# Patient Record
Sex: Female | Born: 1965 | Race: Black or African American | Hispanic: No | Smoking: Former smoker
Health system: Southern US, Community
[De-identification: ages and names within clinical notes are randomized; demographics above are authoritative.]

---

## 2006-12-20 ENCOUNTER — Emergency Department: Payer: Self-pay | Admitting: Emergency Medicine

## 2007-09-10 ENCOUNTER — Emergency Department: Payer: Self-pay | Admitting: Emergency Medicine

## 2008-07-22 ENCOUNTER — Emergency Department: Payer: Self-pay | Admitting: Emergency Medicine

## 2010-11-29 ENCOUNTER — Emergency Department: Payer: Self-pay | Admitting: Emergency Medicine

## 2011-07-11 HISTORY — PX: ANKLE SURGERY: SHX546

## 2012-09-10 ENCOUNTER — Emergency Department: Payer: Self-pay | Admitting: Emergency Medicine

## 2014-09-03 ENCOUNTER — Emergency Department: Payer: Self-pay | Admitting: Emergency Medicine

## 2014-12-26 ENCOUNTER — Emergency Department
Admission: EM | Admit: 2014-12-26 | Discharge: 2014-12-26 | Disposition: A | Discharge status: Home / Self Care | Payer: Self-pay | Attending: Emergency Medicine | Admitting: Emergency Medicine

## 2014-12-26 ENCOUNTER — Encounter: Payer: Self-pay | Admitting: Emergency Medicine

## 2014-12-26 DIAGNOSIS — L089 Local infection of the skin and subcutaneous tissue, unspecified: Secondary | ICD-10-CM | POA: Insufficient documentation

## 2014-12-26 DIAGNOSIS — Z720 Tobacco use: Secondary | ICD-10-CM | POA: Insufficient documentation

## 2014-12-26 DIAGNOSIS — H6123 Impacted cerumen, bilateral: Secondary | ICD-10-CM | POA: Insufficient documentation

## 2014-12-26 MED ORDER — CARBAMIDE PEROXIDE 6.5 % OT SOLN: 5.0000 [drp] | Freq: Two times a day (BID) | OTIC | Status: AC

## 2014-12-26 MED ORDER — CEPHALEXIN 500 MG PO CAPS: 500.0000 mg | ORAL_CAPSULE | Freq: Three times a day (TID) | ORAL | Status: DC

## 2014-12-26 NOTE — ED Notes (Signed)
NAD noted at time of D/C. Pt denies questions or concerns. Pt ambulatory to the lobby at this time.  

## 2014-12-26 NOTE — ED Notes (Signed)
States she developed some itching to left ear yesterday ..put a hair clip in ear to scratch it. Continues to have pain

## 2014-12-26 NOTE — Discharge Instructions (Signed)
Cerumen Impaction °A cerumen impaction is when the wax in your ear forms a plug. This plug usually causes reduced hearing. Sometimes it also causes an earache or dizziness. Removing a cerumen impaction can be difficult and painful. The wax sticks to the ear canal. The canal is sensitive and bleeds easily. If you try to remove a heavy wax buildup with a cotton tipped swab, you may push it in further. °Irrigation with water, suction, and small ear curettes may be used to clear out the wax. If the impaction is fixed to the skin in the ear canal, ear drops may be needed for a few days to loosen the wax. People who build up a lot of wax frequently can use ear wax removal products available in your local drugstore. °SEEK MEDICAL CARE IF:  °You develop an earache, increased hearing loss, or marked dizziness. °Document Released: 08/03/2004 Document Revised: 09/18/2011 Document Reviewed: 09/23/2009 °ExitCare® Patient Information ©2015 [employer]. This information is not intended to replace advice given to you by your health care provider. Make sure you discuss any questions you have with your health care provider. ° °

## 2014-12-26 NOTE — ED Provider Notes (Signed)
[facility] Emergency Department Provider Note  ____________________________________________  Time seen: 10:42 AM  I have reviewed the triage vital signs and the nursing notes.   HISTORY  Chief Complaint Otalgia   HPI Deanna Jones is a 49 y.o. female states that her left ear was itching yesterday and she used a hair clip to scratch it. Now she has pain to her left ear and is swelling. She denies any problems with hearing. At this time she cannot put a score to her pain. Not taking any medication over-the-counter for pain. Is not tried any medication for the itching in her ear as well.   History reviewed. No pertinent past medical history.  There are no active problems to display for this patient.   History reviewed. No pertinent past surgical history.  Current Outpatient Rx  Name  Route  Sig  Dispense  Refill  . carbamide peroxide (DEBROX) 6.5 % otic solution   Both Ears   Place 5 drops into both ears 2 (two) times daily.   15 mL   2   . cephALEXin (KEFLEX) 500 MG capsule   Oral   Take 1 capsule (500 mg total) by mouth 3 (three) times daily.   21 capsule   0     Allergies Review of patient's allergies indicates no known allergies.  No family history on file.  Social History History  Substance Use Topics  . Smoking status: Current Some Day Smoker  . Smokeless tobacco: Not on file  . Alcohol Use: No    Review of Systems Constitutional: No fever/chills ENT: No sore throat. Cardiovascular: Denies chest pain. Respiratory: Denies shortness of breath. Gastrointestinal: No abdominal pain.  No nausea, no vomiting. Genitourinary: Negative for dysuria. Musculoskeletal: Negative for back pain. Skin: Negative for rash. Neurological: Negative for headaches  10-point ROS otherwise negative.  ____________________________________________   PHYSICAL EXAM:  VITAL SIGNS: ED Triage Vitals  Enc Vitals Group     BP --      Pulse --       Resp --      Temp --      Temp src --      SpO2 --      Weight --      Height --      Head Cir --      Peak Flow --      Pain Score --      Pain Loc --      Pain Edu? --      Excl. in GC? --     Constitutional: Alert and oriented. Well appearing and in no acute distress. Eyes: Conjunctivae are normal. PERRL. EOMI. Head: Atraumatic. Nose: No congestion/rhinnorhea. Bilateral EACs are occluded with cerumen. TMs were not visible. Left EAC is edematous with a superficial abrasion most likely from the hair clip she was using. There is some mild edema in the area present. There is moderate tenderness on touching the ear. Mouth/Throat: Mucous membranes are moist.  Oropharynx non-erythematous. Neck: No stridor.  Supple Hematological/Lymphatic/Immunilogical: No cervical lymphadenopathy. Cardiovascular: Normal rate, regular rhythm. Grossly normal heart sounds.  Good peripheral circulation. Respiratory: Normal respiratory effort.  No retractions. Lungs CTAB. Gastrointestinal: Soft and nontender. No distention. Musculoskeletal: No lower extremity tenderness nor edema.  No joint effusions. Neurologic:  Normal speech and language. No gross focal neurologic deficits are appreciated. Speech is normal. No gait instability. Skin:  Skin is warm, dry. There is some mild edema as described on the  left ear externally. See above note. Psychiatric: Mood and affect are normal. Speech and behavior are normal.  ____________________________________________   LABS (all labs ordered are listed, but only abnormal results are displayed)  Labs Reviewed - No data to display  PROCEDURES  Procedure(s) performed: None  Critical Care performed: No  ____________________________________________   INITIAL IMPRESSION / ASSESSMENT AND PLAN / ED COURSE  Pertinent labs & imaging results that were available during my care of the patient were reviewed by me and considered in my medical decision making (see chart  for details).  Patient is to follow-up with Dr. Willeen Cass at [facility] ear nose and throat if any continued problems. ____________________________________________   FINAL CLINICAL IMPRESSION(S) / ED DIAGNOSES  Final diagnoses:  Cerumen impaction, bilateral  Superficial skin infection      Tommi Rumps, PA-C 12/26/14 1133  Emily Filbert, MD 12/26/14 1435

## 2015-01-16 ENCOUNTER — Encounter: Payer: Self-pay | Admitting: General Practice

## 2015-01-16 ENCOUNTER — Emergency Department
Admission: EM | Admit: 2015-01-16 | Discharge: 2015-01-16 | Disposition: A | Discharge status: Home / Self Care | Payer: Self-pay | Attending: Emergency Medicine | Admitting: Emergency Medicine

## 2015-01-16 DIAGNOSIS — Y998 Other external cause status: Secondary | ICD-10-CM | POA: Insufficient documentation

## 2015-01-16 DIAGNOSIS — L089 Local infection of the skin and subcutaneous tissue, unspecified: Secondary | ICD-10-CM

## 2015-01-16 DIAGNOSIS — Y9289 Other specified places as the place of occurrence of the external cause: Secondary | ICD-10-CM | POA: Insufficient documentation

## 2015-01-16 DIAGNOSIS — Z720 Tobacco use: Secondary | ICD-10-CM | POA: Insufficient documentation

## 2015-01-16 DIAGNOSIS — Z79899 Other long term (current) drug therapy: Secondary | ICD-10-CM | POA: Insufficient documentation

## 2015-01-16 DIAGNOSIS — W57XXXA Bitten or stung by nonvenomous insect and other nonvenomous arthropods, initial encounter: Secondary | ICD-10-CM | POA: Insufficient documentation

## 2015-01-16 DIAGNOSIS — Y9389 Activity, other specified: Secondary | ICD-10-CM | POA: Insufficient documentation

## 2015-01-16 DIAGNOSIS — S80862A Insect bite (nonvenomous), left lower leg, initial encounter: Secondary | ICD-10-CM | POA: Insufficient documentation

## 2015-01-16 DIAGNOSIS — Z792 Long term (current) use of antibiotics: Secondary | ICD-10-CM | POA: Insufficient documentation

## 2015-01-16 MED ORDER — SULFAMETHOXAZOLE-TRIMETHOPRIM 800-160 MG PO TABS: 1.0000 | ORAL_TABLET | Freq: Two times a day (BID) | ORAL | Status: DC

## 2015-01-16 MED ORDER — TRAMADOL HCL 50 MG PO TABS: 50.0000 mg | ORAL_TABLET | Freq: Four times a day (QID) | ORAL | Status: DC | PRN

## 2015-01-16 MED ORDER — LIDOCAINE-EPINEPHRINE-TETRACAINE (LET) SOLUTION
NASAL | Status: AC
  Filled 2015-01-16: qty 3

## 2015-01-16 NOTE — ED Notes (Signed)
Pt. arrived to ed from home with an abscess to left lower leg. Pt reports site appeared yesterday, reports unknown cause. Pt. Alert and Oriented. Ambulatory with no Acute distress. No drainage from site at this time.

## 2015-01-16 NOTE — ED Provider Notes (Signed)
[facility] Emergency Department Provider Note  ____________________________________________  Time seen: Approximately 9:44 AM  I have reviewed the triage vital signs and the nursing notes.   HISTORY  Chief Complaint Abscess    HPI Deanna Jones is a 49 y.o. female patient has multiple erythematous papular lesions on her body. Patient's chief concern is one that a papular lesions attending to a bullous lesion on her left lower leg. Patient states she normally tries to care express material from the lesions but she is concerned because of the size of the swelling patient denies any fever with this complaint she denies any loss of sensation or loss of function of the left lower extremity. Patient states there is no pain with this complaint.  History reviewed. No pertinent past medical history.  There are no active problems to display for this patient.   History reviewed. No pertinent past surgical history.  Current Outpatient Rx  Name  Route  Sig  Dispense  Refill  . carbamide peroxide (DEBROX) 6.5 % otic solution   Both Ears   Place 5 drops into both ears 2 (two) times daily.   15 mL   2   . cephALEXin (KEFLEX) 500 MG capsule   Oral   Take 1 capsule (500 mg total) by mouth 3 (three) times daily.   21 capsule   0   . sulfamethoxazole-trimethoprim (BACTRIM DS,SEPTRA DS) 800-160 MG per tablet   Oral   Take 1 tablet by mouth 2 (two) times daily.   20 tablet   0   . traMADol (ULTRAM) 50 MG tablet   Oral   Take 1 tablet (50 mg total) by mouth every 6 (six) hours as needed for moderate pain.   12 tablet   0     Allergies Review of patient's allergies indicates no known allergies.  No family history on file.  Social History History  Substance Use Topics  . Smoking status: Current Some Day Smoker -- 0.50 packs/day    Types: Cigarettes  . Smokeless tobacco: Not on file  . Alcohol Use: No    Review of Systems Constitutional: No  fever/chills Eyes: No visual changes. ENT: No sore throat. Cardiovascular: Denies chest pain. Respiratory: Denies shortness of breath. Gastrointestinal: No abdominal pain.  No nausea, no vomiting.  No diarrhea.  No constipation. Genitourinary: Negative for dysuria. Musculoskeletal: Negative for back pain. Skin: Negative for rash. Multiple erythematous papular lesions on the lower extremities and 1 list lesion on the left leg. lesion Neurological: Negative for headaches, focal weakness or numbness. 10-point ROS otherwise negative.  ____________________________________________   PHYSICAL EXAM:  VITAL SIGNS: ED Triage Vitals  Enc Vitals Group     BP 01/16/15 0922 126/68 mmHg     Pulse Rate 01/16/15 0922 79     Resp 01/16/15 0922 17     Temp 01/16/15 0922 97.7 F (36.5 C)     Temp Source 01/16/15 0922 Oral     SpO2 01/16/15 0922 99 %     Weight 01/16/15 0922 160 lb (72.576 kg)     Height 01/16/15 0922  (1.549 m)     Head Cir --      Peak Flow --      Pain Score 01/16/15 0923 0     Pain Loc --      Pain Edu? --      Excl. in GC? --    Constitutional: Alert and oriented. Well appearing and in no acute distress.  Appears anxious Eyes: Conjunctivae are normal. PERRL. EOMI. Head: Atraumatic. Nose: No congestion/rhinnorhea. Mouth/Throat: Mucous membranes are moist.  Oropharynx non-erythematous. Neck: No stridor. No cervical spine tenderness to palpation. Hematological/Lymphatic/Immunilogical: No cervical lymphadenopathy. Cardiovascular: Normal rate, regular rhythm. Grossly normal heart sounds.  Good peripheral circulation. Respiratory: Normal respiratory effort.  No retractions. Lungs CTAB. Gastrointestinal: Soft and nontender. No distention. No abdominal bruits. No CVA tenderness. Musculoskeletal: No lower extremity tenderness nor edema.  No joint effusions. Neurologic:  Normal speech and language. No gross focal neurologic deficits are appreciated. Speech is normal. No gait  instability. Skin:  Skin is warm, dry and intact. No rash noted. Psychiatric: Mood and affect are normal. Speech and behavior are normal.  ____________________________________________   LABS (all labs ordered are listed, but only abnormal results are displayed)  Labs Reviewed - No data to display ____________________________________________  EKG   ____________________________________________  RADIOLOGY   ____________________________________________   PROCEDURES  Procedure(s) performed: None  Critical Care performed: No  ____________________________________________   INITIAL IMPRESSION / ASSESSMENT AND PLAN / ED COURSE  Pertinent labs & imaging results that were available during my care of the patient were reviewed by me and considered in my medical decision making (see chart for details).  Infected insect bite left lower extremity. Couple anesthesia was applied to the area not to 5 minutes 18-gauge needle puncture expressed purulent material which was sent off for culture and sensitivity. Area was cleaned and bandage. She'll get a prescription for Bactrim and tramadol to take as directed. ____________________________________________   FINAL CLINICAL IMPRESSION(S) / ED DIAGNOSES  Final diagnoses:  Infected insect bite of left leg, initial encounter      Joni ReiningRonald K Braylei Totino, PA-C 01/16/15 1013  Minna AntisKevin Paduchowski, MD 01/16/15 1527

## 2015-01-16 NOTE — ED Notes (Signed)
D/c instructions reviewed w/ pt - pt denies any further questions or concerns at present.  Pt instructed to not use alcohol, drive, or operate heavy machinery while take the prescription pain medications as they could make him drowsy - pt verbalized understanding.   

## 2015-05-31 ENCOUNTER — Encounter: Payer: Self-pay | Admitting: Emergency Medicine

## 2015-05-31 ENCOUNTER — Emergency Department
Admission: EM | Admit: 2015-05-31 | Discharge: 2015-05-31 | Disposition: A | Discharge status: Home / Self Care | Payer: Self-pay | Attending: Emergency Medicine | Admitting: Emergency Medicine

## 2015-05-31 DIAGNOSIS — H6121 Impacted cerumen, right ear: Secondary | ICD-10-CM | POA: Insufficient documentation

## 2015-05-31 DIAGNOSIS — F17210 Nicotine dependence, cigarettes, uncomplicated: Secondary | ICD-10-CM | POA: Insufficient documentation

## 2015-05-31 DIAGNOSIS — I499 Cardiac arrhythmia, unspecified: Secondary | ICD-10-CM | POA: Insufficient documentation

## 2015-05-31 DIAGNOSIS — H9201 Otalgia, right ear: Secondary | ICD-10-CM | POA: Insufficient documentation

## 2015-05-31 LAB — CBC
HCT: 45.1 % (ref 35.0–47.0)
Hemoglobin: 15.2 g/dL (ref 12.0–16.0)
MCH: 31.8 pg (ref 26.0–34.0)
MCHC: 33.6 g/dL (ref 32.0–36.0)
MCV: 94.4 fL (ref 80.0–100.0)
Platelets: 474 10*3/uL — ABNORMAL HIGH (ref 150–440)
RBC: 4.78 MIL/uL (ref 3.80–5.20)
RDW: 13.4 % (ref 11.5–14.5)
WBC: 10.2 10*3/uL (ref 3.6–11.0)

## 2015-05-31 LAB — COMPREHENSIVE METABOLIC PANEL
ALT: 21 U/L (ref 14–54)
AST: 26 U/L (ref 15–41)
Albumin: 4.2 g/dL (ref 3.5–5.0)
Alkaline Phosphatase: 82 U/L (ref 38–126)
Anion gap: 7 (ref 5–15)
BUN: 13 mg/dL (ref 6–20)
CO2: 27 mmol/L (ref 22–32)
Calcium: 9.2 mg/dL (ref 8.9–10.3)
Chloride: 106 mmol/L (ref 101–111)
Creatinine, Ser: 0.63 mg/dL (ref 0.44–1.00)
GFR calc Af Amer: >60 mL/min (ref >60)
GFR calc non Af Amer: >60 mL/min (ref >60)
Glucose, Bld: 106 mg/dL — ABNORMAL HIGH (ref 65–99)
Potassium: 4.8 mmol/L (ref 3.5–5.1)
Sodium: 140 mmol/L (ref 135–145)
Total Bilirubin: 0.9 mg/dL (ref 0.3–1.2)
Total Protein: 8 g/dL (ref 6.5–8.1)

## 2015-05-31 LAB — TROPONIN I: Troponin I: <0.03 ng/mL (ref <0.031)

## 2015-05-31 NOTE — Discharge Instructions (Signed)
You were found to have an abnormal heart rate in the emergency department which appeared to normalize prior to discharge. Please follow-up with cardiology. Return to the emergency department if you have any dizziness or chest pain   Earache An earache, also called otalgia, can be caused by many things. Pain from an earache can be sharp, dull, or burning. The pain may be temporary or constant. Earaches can be caused by problems with the ear, such as infection in either the middle ear or the ear canal, injury, impacted ear wax, middle ear pressure, or a foreign body in the ear. Ear pain can also result from problems in other areas. This is called referred pain. For example, pain can come from a sore throat, a tooth infection, or problems with the jaw or the joint between the jaw and the skull (temporomandibular joint, or TMJ). The cause of an earache is not always easy to identify. Watchful waiting may be appropriate for some earaches until a clear cause of the pain can be found. HOME CARE INSTRUCTIONS Watch your condition for any changes. The following actions may help to lessen any discomfort that you are feeling:  Take medicines only as directed by your health care provider. This includes ear drops.  Apply ice to your outer ear to help reduce pain.  Put ice in a plastic bag.  Place a towel between your skin and the bag.  Leave the ice on for 20 minutes, 2-3 times per day.  Do not put anything in your ear other than medicine that is prescribed by your health care provider.  Try resting in an upright position instead of lying down. This may help to reduce pressure in the middle ear and relieve pain.  Chew gum if it helps to relieve your ear pain.  Control any allergies that you have.  Keep all follow-up visits as directed by your health care provider. This is important. SEEK MEDICAL CARE IF:  Your pain does not improve within 2 days.  You have a fever.  You have new or worsening  symptoms. SEEK IMMEDIATE MEDICAL CARE IF:  You have a severe headache.  You have a stiff neck.  You have difficulty swallowing.  You have redness or swelling behind your ear.  You have drainage from your ear.  You have hearing loss.  You feel dizzy.   This information is not intended to replace advice given to you by your health care provider. Make sure you discuss any questions you have with your health care provider.   Document Released: 02/11/2004 Document Revised: 07/17/2014 Document Reviewed: 01/25/2014 Elsevier Interactive Patient Education [employer]2016 Elsevier Inc.

## 2015-05-31 NOTE — ED Notes (Signed)
Pt discharged home after verbalizing understanding of discharge instructions; nad noted. 

## 2015-05-31 NOTE — ED Notes (Signed)
Patient brought her daughter in to be seen and began having right sided ear pain. Wants to be checked.

## 2015-05-31 NOTE — ED Provider Notes (Signed)
[facility] Emergency Department Provider Note  ____________________________________________  Time seen: On arrival  I have reviewed the triage vital signs and the nursing notes.   HISTORY  Chief Complaint Otalgia    HPI Deanna Jones is a 49 y.o. female who presents with complaints of right ear discomfort. She only noticed it this morning. She is here with her daughter to have her evaluated for upper respiratory infection but decided to get checked in as well. She denies hearing difficulty. No fevers or chills. No chest pain or shortness of breath or cough. No foreign bodies in the ear    History reviewed. No pertinent past medical history.  There are no active problems to display for this patient.   History reviewed. No pertinent past surgical history.  Current Outpatient Rx  Name  Route  Sig  Dispense  Refill  . ibuprofen (ADVIL,MOTRIN) 200 MG tablet   Oral   Take 200 mg by mouth every 6 (six) hours as needed for mild pain or moderate pain.          . carbamide peroxide (DEBROX) 6.5 % otic solution   Both Ears   Place 5 drops into both ears 2 (two) times daily. Patient not taking: Reported on 05/31/2015   15 mL   2   . cephALEXin (KEFLEX) 500 MG capsule   Oral   Take 1 capsule (500 mg total) by mouth 3 (three) times daily. Patient not taking: Reported on 05/31/2015   21 capsule   0   . sulfamethoxazole-trimethoprim (BACTRIM DS,SEPTRA DS) 800-160 MG per tablet   Oral   Take 1 tablet by mouth 2 (two) times daily. Patient not taking: Reported on 05/31/2015   20 tablet   0   . traMADol (ULTRAM) 50 MG tablet   Oral   Take 1 tablet (50 mg total) by mouth every 6 (six) hours as needed for moderate pain. Patient not taking: Reported on 05/31/2015   12 tablet   0     Allergies Review of patient's allergies indicates no known allergies.  No family history on file.  Social History Social History  Substance Use Topics  .  Smoking status: Current Some Day Smoker -- 0.50 packs/day    Types: Cigarettes  . Smokeless tobacco: None  . Alcohol Use: No    Review of Systems  Constitutional: Negative for fever. Eyes: Negative for visual changes. ENT: Negative for sore throat, positive for ear discomfort   Genitourinary: Negative for dysuria. Musculoskeletal: Negative for back pain. Skin: Negative for rash. Neurological: Negative for headaches or focal weakness   ____________________________________________   PHYSICAL EXAM:  VITAL SIGNS: ED Triage Vitals  Enc Vitals Group     BP 05/31/15 0743 146/93 mmHg     Pulse Rate 05/31/15 0743 39     Resp 05/31/15 0743 16     Temp 05/31/15 0743 98.2 F (36.8 C)     Temp Source 05/31/15 0743 Oral     SpO2 05/31/15 0743 97 %     Weight 05/31/15 0743 158 lb (71.668 kg)     Height 05/31/15 0743 5\' 1"  (1.549 m)     Head Cir --      Peak Flow --      Pain Score 05/31/15 0832 1     Pain Loc --      Pain Edu? --      Excl. in GC? --      Constitutional: Alert and oriented. Well appearing  and in no distress. Eyes: Conjunctivae are normal.  ENT   Head: Normocephalic and atraumatic.   Mouth/Throat: Mucous membranes are moist. Ear: Significant cerumen in right ear canal, after removal no foreign body, normal TM Cardiovascular: Normal rate, regular rhythm.  Respiratory: Normal respiratory effort without tachypnea nor retractions.  Gastrointestinal: Soft and non-tender in all quadrants. No distention. There is no CVA tenderness. Musculoskeletal: Nontender with normal range of motion in all extremities. Neurologic:  Normal speech and language. No gross focal neurologic deficits are appreciated. Skin:  Skin is warm, dry and intact. No rash noted. Psychiatric: Mood and affect are normal. Patient exhibits appropriate insight and judgment.  ____________________________________________    LABS (pertinent positives/negatives)  Labs Reviewed  CBC -  Abnormal; Notable for the following:    Platelets 474 (*)    All other components within normal limits  COMPREHENSIVE METABOLIC PANEL - Abnormal; Notable for the following:    Glucose, Bld 106 (*)    All other components within normal limits  TROPONIN I    ____________________________________________   ED ECG REPORT I, Jene Every, the attending physician, personally viewed and interpreted this ECG.   Date: 05/31/2015  EKG Time: 8:30 AM  Rate: 73  Rhythm: Sinus arrhythmia  Axis: Normal  Intervals:none  ST&T Change: Normal  ED ECG REPORT I, Jene Every, the attending physician, personally viewed and interpreted this ECG.   Date: 05/31/2015  EKG Time: 10:34 AM  Rate: 63  Rhythm: normal EKG, normal sinus rhythm  Axis: Normal  Intervals:none  ST&T Change: Normal  ____________________________________________    RADIOLOGY I have personally reviewed any xrays that were ordered on this patient: None  ____________________________________________   PROCEDURES  Procedure(s) performed: cerumen disimpaction with curette and irrigation performed by me   ____________________________________________   INITIAL IMPRESSION / ASSESSMENT AND PLAN / ED COURSE  Pertinent labs & imaging results that were available during my care of the patient were reviewed by me and considered in my medical decision making (see chart for details).  Patient noted to have abnormal heart rhythm in triage so EKG was done which shows a sinus arrhythmia, labs are unremarkable. Repeat EKG is normal sinus rhythm and a normal rate. Patient has no symptoms and is anxious to leave. She agrees to follow up with cardiology.  ____________________________________________   FINAL CLINICAL IMPRESSION(S) / ED DIAGNOSES  Final diagnoses:  Otalgia of right ear  Cardiac arrhythmia, unspecified cardiac arrhythmia type     Jene Every, MD 05/31/15 1536

## 2015-05-31 NOTE — ED Notes (Signed)
Pt here for ear ache (mostly here for daughter, but decided to be seen as well). Irregular HR noted upon triage, so cardiac assessment performed. Pt denies chest pain, sob, any other symptoms. NAD noted.

## 2015-06-15 ENCOUNTER — Encounter: Payer: Self-pay | Admitting: Emergency Medicine

## 2015-06-15 ENCOUNTER — Emergency Department
Admission: EM | Admit: 2015-06-15 | Discharge: 2015-06-15 | Disposition: A | Discharge status: Home / Self Care | Payer: Self-pay | Attending: Emergency Medicine | Admitting: Emergency Medicine

## 2015-06-15 DIAGNOSIS — Z792 Long term (current) use of antibiotics: Secondary | ICD-10-CM | POA: Insufficient documentation

## 2015-06-15 DIAGNOSIS — H109 Unspecified conjunctivitis: Secondary | ICD-10-CM | POA: Insufficient documentation

## 2015-06-15 DIAGNOSIS — F17210 Nicotine dependence, cigarettes, uncomplicated: Secondary | ICD-10-CM | POA: Insufficient documentation

## 2015-06-15 MED ORDER — SULFACETAMIDE SODIUM 10 % OP SOLN: 2.0000 [drp] | Freq: Four times a day (QID) | OPHTHALMIC | Status: DC

## 2015-06-15 NOTE — ED Notes (Signed)
Unable to sign due to computer issues

## 2015-06-15 NOTE — Discharge Instructions (Signed)

## 2015-06-15 NOTE — ED Notes (Signed)
C/o pain and pressure behind eyes and redness and itching to both eyes since she woke up this am

## 2015-06-15 NOTE — ED Notes (Signed)
Pt complaining of pressure above both eyes x 1 day, as well as redness, itching.  Some redness noted.

## 2015-06-15 NOTE — ED Provider Notes (Signed)
[facility] Emergency Department Provider Note  ____________________________________________  Time seen: Approximately 3:11 PM  I have reviewed the triage vital signs and the nursing notes.   HISTORY  Chief Complaint Eye Pain   HPI Deanna Jones is a 49 y.o. female complains of bilateral eye pain with redness every morning with itching. Occasional drainage bilaterally this morning. Feels pressure behind both eyes. Denies any runny nose sinus congestion or drainage.    History reviewed. No pertinent past medical history.  There are no active problems to display for this patient.   History reviewed. No pertinent past surgical history.  Current Outpatient Rx  Name  Route  Sig  Dispense  Refill  . carbamide peroxide (DEBROX) 6.5 % otic solution   Both Ears   Place 5 drops into both ears 2 (two) times daily. Patient not taking: Reported on 05/31/2015   15 mL   2   . cephALEXin (KEFLEX) 500 MG capsule   Oral   Take 1 capsule (500 mg total) by mouth 3 (three) times daily. Patient not taking: Reported on 05/31/2015   21 capsule   0   . ibuprofen (ADVIL,MOTRIN) 200 MG tablet   Oral   Take 200 mg by mouth every 6 (six) hours as needed for mild pain or moderate pain.          Marland Kitchen sulfacetamide (BLEPH-10) 10 % ophthalmic solution   Both Eyes   Place 2 drops into both eyes 4 (four) times daily.   5 mL   0   . sulfamethoxazole-trimethoprim (BACTRIM DS,SEPTRA DS) 800-160 MG per tablet   Oral   Take 1 tablet by mouth 2 (two) times daily. Patient not taking: Reported on 05/31/2015   20 tablet   0   . traMADol (ULTRAM) 50 MG tablet   Oral   Take 1 tablet (50 mg total) by mouth every 6 (six) hours as needed for moderate pain. Patient not taking: Reported on 05/31/2015   12 tablet   0     Allergies Review of patient's allergies indicates no known allergies.  No family history on file.  Social History Social History  Substance Use  Topics  . Smoking status: Current Some Day Smoker -- 0.50 packs/day    Types: Cigarettes  . Smokeless tobacco: None  . Alcohol Use: No    Review of Systems Constitutional: No fever/chills Eyes: No visual changes. Positive eye redness ENT: No sore throat. Cardiovascular: Denies chest pain. Respiratory: Denies shortness of breath. Gastrointestinal: No abdominal pain.  No nausea, no vomiting.  No diarrhea.  No constipation. Genitourinary: Negative for dysuria. Musculoskeletal: Negative for back pain. Skin: Negative for rash. Neurological: Negative for headaches, focal weakness or numbness.  10-point ROS otherwise negative.  ____________________________________________   PHYSICAL EXAM:  VITAL SIGNS: ED Triage Vitals  Enc Vitals Group     BP 06/15/15 1410 157/83 mmHg     Pulse Rate 06/15/15 1410 50     Resp 06/15/15 1410 18     Temp 06/15/15 1410 98.1 F (36.7 C)     Temp Source 06/15/15 1410 Oral     SpO2 06/15/15 1410 98 %     Weight 06/15/15 1410 160 lb (72.576 kg)     Height 06/15/15 1410  (1.549 m)     Head Cir --      Peak Flow --      Pain Score 06/15/15 1410 3     Pain Loc --  Pain Edu? --      Excl. in GC? --     Constitutional: Alert and oriented. Well appearing and in no acute distress. Eyes: Conjunctivae are normal. PERRL. EOMI. conjunctiva slightly erythematous bilateral with no obvious discharge. Head: Atraumatic. Nose: No congestion/rhinnorhea. Mouth/Throat: Mucous membranes are moist.  Oropharynx non-erythematous. Neck: No stridor.   Musculoskeletal: No lower extremity tenderness nor edema.  No joint effusions. Neurologic:  Normal speech and language. No gross focal neurologic deficits are appreciated. No gait instability. Skin:  Skin is warm, dry and intact. No rash noted. Psychiatric: Mood and affect are normal. Speech and behavior are normal.  ____________________________________________   LABS (all labs ordered are listed, but only  abnormal results are displayed)  Labs Reviewed - No data to display ____________________________________________   PROCEDURES  Procedure(s) performed: None  Critical Care performed: No  ____________________________________________   INITIAL IMPRESSION / ASSESSMENT AND PLAN / ED COURSE  Pertinent labs & imaging results that were available during my care of the patient were reviewed by me and considered in my medical decision making (see chart for details).  Bilateral conjunctivitis. Rx given for Sodium Sulamyd eye drops. Patient follow-up with PCP or return here for any worsening symptomology. Patient voices no other emergency medical complaints at this visit. ____________________________________________   FINAL CLINICAL IMPRESSION(S) / ED DIAGNOSES  Final diagnoses:  Bilateral conjunctivitis      Evangeline DakinCharles M Courtnay Petrilla, PA-C 06/15/15 1557  Rockne MenghiniAnne-Caroline Norman, MD 06/15/15 2002

## 2017-04-30 ENCOUNTER — Emergency Department
Admission: EM | Admit: 2017-04-30 | Discharge: 2017-04-30 | Disposition: A | Discharge status: Home / Self Care | Payer: Self-pay | Attending: Emergency Medicine | Admitting: Emergency Medicine

## 2017-04-30 ENCOUNTER — Encounter: Payer: Self-pay | Admitting: Emergency Medicine

## 2017-04-30 DIAGNOSIS — M545 Low back pain, unspecified: Secondary | ICD-10-CM

## 2017-04-30 DIAGNOSIS — Z79899 Other long term (current) drug therapy: Secondary | ICD-10-CM | POA: Insufficient documentation

## 2017-04-30 DIAGNOSIS — F17210 Nicotine dependence, cigarettes, uncomplicated: Secondary | ICD-10-CM | POA: Insufficient documentation

## 2017-04-30 LAB — URINALYSIS, COMPLETE (UACMP) WITH MICROSCOPIC
Bilirubin Urine: NEGATIVE
Glucose, UA: NEGATIVE mg/dL
Hgb urine dipstick: NEGATIVE
Ketones, ur: NEGATIVE mg/dL
Leukocytes, UA: NEGATIVE
Nitrite: NEGATIVE
Protein, ur: NEGATIVE mg/dL
Specific Gravity, Urine: 1.01 (ref 1.005–1.030)
WBC, UA: NONE SEEN WBC/hpf (ref 0–5)
pH: 8 (ref 5.0–8.0)

## 2017-04-30 MED ORDER — NAPROXEN 500 MG PO TABS: 500.0000 mg | ORAL_TABLET | Freq: Two times a day (BID) | ORAL | 2 refills | Status: DC

## 2017-04-30 NOTE — ED Provider Notes (Signed)
[facility] Emergency Department Provider Note   ____________________________________________    I have reviewed the triage vital signs and the nursing notes.   HISTORY  Chief Complaint Back pain    HPI Deanna Jones is a 51 y.o. female who presents with complaints of back pain. 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 She denies numbness or tingling in her lower extremities. No dysuria. No history of kidney stones. Pain is worse when she twists her body. She has not taken anything for this. Pain is mild 1 lying still, worse with movement.   History reviewed. No pertinent past medical history.  There are no active problems to display for this patient.   History reviewed. No pertinent surgical history.  Prior to Admission medications   Medication Sig Start Date End Date Taking? Authorizing Provider  cephALEXin (KEFLEX) 500 MG capsule Take 1 capsule (500 mg total) by mouth 3 (three) times daily. Patient not taking: Reported on 05/31/2015 12/26/14   Tommi RumpsSummers, Rhonda L, PA-C  ibuprofen (ADVIL,MOTRIN) 200 MG tablet Take 200 mg by mouth every 6 (six) hours as needed for mild pain or moderate pain.     [provider]  naproxen (NAPROSYN) 500 MG tablet Take 1 tablet (500 mg total) by mouth 2 (two) times daily with a meal. 04/30/17   Jene EveryKinner, Clemence Lengyel, MD  sulfacetamide (BLEPH-10) 10 % ophthalmic solution Place 2 drops into both eyes 4 (four) times daily. 06/15/15   Beers, Charmayne Sheerharles M, PA-C  sulfamethoxazole-trimethoprim (BACTRIM DS,SEPTRA DS) 800-160 MG per tablet Take 1 tablet by mouth 2 (two) times daily. Patient not taking: Reported on 05/31/2015 01/16/15   Joni ReiningSmith, Ronald K, PA-C  traMADol (ULTRAM) 50 MG tablet Take 1 tablet (50 mg total) by mouth every 6 (six) hours as needed for moderate pain. Patient not taking: Reported on 05/31/2015 01/16/15   Joni ReiningSmith, Ronald K, PA-C      Allergies Patient has no known allergies.  No family history on file.  Social History Social History  Substance Use Topics  . Smoking status: Current Some Day Smoker    Packs/day: 0.50    Types: Cigarettes  . Smokeless tobacco: Never Used  . Alcohol use No    Review of Systems  Constitutional: No fever/chills Eyes: No visual changes.  ENT: No sore throat. Cardiovascular: Denies chest pain. Respiratory: Denies shortness of breath. Gastrointestinal: No abdominal pain.  No nausea, no vomiting.   Genitourinary: Negative for dysuria. Musculoskeletal: As above Skin: Negative for rash. Neurological: Negative for headaches   ____________________________________________   PHYSICAL EXAM:  VITAL SIGNS: ED Triage Vitals  Enc Vitals Group     BP 04/30/17 1555 (!) 160/105     Pulse Rate 04/30/17 1555 70     Resp 04/30/17 1555 20     Temp 04/30/17 1555 98.4 F (36.9 C)     Temp Source 04/30/17 1555 Oral     SpO2 04/30/17 1555 98 %     Weight 04/30/17 1555 72.6 kg (160 lb)     Height 04/30/17 1555 1.549 m (5\' 1" )     Head Circumference --      Peak Flow --      Pain Score 04/30/17 1554 7     Pain Loc --      Pain Edu? --      Excl. in GC? --     Constitutional: Alert  and oriented. No acute distress. Pleasant and interactive Eyes: Conjunctivae are normal.  H Nose: No congestion/rhinnorhea. Mouth/Throat: Mucous membranes are moist.    Cardiovascular: Normal rate, regular rhythm. ood peripheral circulation. Respiratory: Normal respiratory effort.  No retractions. Gastrointestinal: Soft and nontender. No distention.  No CVA tenderness. Genitourinary: deferred Musculoskeletal: Patient with a muscle spasm/tenderness to palpation at approximately T11, right paraspinal area. No rash Neurologic:  Normal speech and language. No gross focal neurologic deficits are appreciated.  Skin:  Skin is warm, dry and intact. No rash noted. Psychiatric: Mood and affect are normal.  Speech and behavior are normal.  ____________________________________________   LABS (all labs ordered are listed, but only abnormal results are displayed)  Labs Reviewed  URINALYSIS, COMPLETE (UACMP) WITH MICROSCOPIC - Abnormal; Notable for the following:       Result Value   Color, Urine YELLOW (*)    APPearance CLEAR (*)    Bacteria, UA RARE (*)    Squamous Epithelial / LPF 0-5 (*)    All other components within normal limits   ____________________________________________  EKG  None ____________________________________________  RADIOLOGY  None ____________________________________________   PROCEDURES  Procedure(s) performed: No    Critical Care performed: No ____________________________________________   INITIAL IMPRESSION / ASSESSMENT AND PLAN / ED COURSE  Pertinent labs & imaging results that were available during my care of the patient were reviewed by me and considered in my medical decision making (see chart for details).  Patient's symptoms are not consistent with kidney stone. Urinalysis is unremarkable. Exam is most consistent with muscular skeletal pain, small spasm. Patient is tender as described above. We'll treat with NSAIDs and rest.    ____________________________________________   FINAL CLINICAL IMPRESSION(S) / ED DIAGNOSES  Final diagnoses:  Acute right-sided low back pain without sciatica      NEW MEDICATIONS STARTED DURING THIS VISIT:  Discharge Medication List as of 04/30/2017  5:28 PM    START taking these medications   Details  naproxen (NAPROSYN) 500 MG tablet Take 1 tablet (500 mg total) by mouth 2 (two) times daily with a meal., Starting Mon 04/30/2017, Print         Note:  This document was prepared using Dragon voice recognition software and may include unintentional dictation errors.    Jene Every, MD 04/30/17 2213

## 2017-04-30 NOTE — ED Triage Notes (Signed)
Patient presents to the ED with right flank pain that began suddenly today and caused patient to double over during work.  Patient reports urinary frequency.  Patient is in no obvious distress at this time.

## 2017-04-30 NOTE — ED Notes (Signed)
Pt states that she has had flank pain that started today in the mid of her back and moved more to the right side. Pt states the pain 3/10. Pt is A/O

## 2019-09-27 ENCOUNTER — Ambulatory Visit: Payer: Self-pay | Attending: Internal Medicine

## 2019-09-27 DIAGNOSIS — Z23 Encounter for immunization: Secondary | ICD-10-CM

## 2019-09-27 NOTE — Progress Notes (Signed)
   Covid-19 Vaccination Clinic  Name:  Deanna Jones    MRN: [id number] DOB: [birth year]  09/27/2019  Deanna Jones was observed post Covid-19 immunization for 15 minutes without incident. She was provided with Vaccine Information Sheet and instruction to access the V-Safe system.   Deanna Jones was instructed to call 911 with any severe reactions post vaccine: Marland Kitchen Difficulty breathing  . Swelling of face and throat  . A fast heartbeat  . A bad rash all over body  . Dizziness and weakness   Immunizations Administered    Name Date Dose VIS Date Route   Pfizer COVID-19 Vaccine 09/27/2019  1:24 PM 0.3 mL 06/20/2019 Intramuscular   Manufacturer: [employer], [employer]   Lot: [id number]   NDC: [address]-2

## 2019-10-22 ENCOUNTER — Ambulatory Visit: Payer: Self-pay | Attending: Internal Medicine

## 2019-10-22 DIAGNOSIS — Z23 Encounter for immunization: Secondary | ICD-10-CM

## 2019-10-22 NOTE — Progress Notes (Signed)
   Covid-19 Vaccination Clinic  Name:  Barri Neidlinger    MRN: [id number] DOB: Jan 02, 1966  10/22/2019  Ms. Weldon Inches was observed post Covid-19 immunization for 15 minutes without incident. She was provided with Vaccine Information Sheet and instruction to access the V-Safe system.   Ms. Sadia Belfiore was instructed to call 911 with any severe reactions post vaccine: Marland Kitchen Difficulty breathing  . Swelling of face and throat  . A fast heartbeat  . A bad rash all over body  . Dizziness and weakness   Immunizations Administered    Name Date Dose VIS Date Route   Pfizer COVID-19 Vaccine 10/22/2019 10:46 AM 0.3 mL 06/20/2019 Intramuscular   Manufacturer: [employer], [employer]   Lot: [id number]   NDC: [address]-3

## 2020-01-05 ENCOUNTER — Other Ambulatory Visit: Payer: Self-pay

## 2020-06-10 ENCOUNTER — Ambulatory Visit: Payer: Self-pay

## 2020-06-26 ENCOUNTER — Ambulatory Visit
Admission: EM | Admit: 2020-06-26 | Discharge: 2020-06-26 | Disposition: A | Discharge status: Home / Self Care | Payer: 59 | Attending: Urgent Care | Admitting: Urgent Care

## 2020-06-26 ENCOUNTER — Ambulatory Visit (INDEPENDENT_AMBULATORY_CARE_PROVIDER_SITE_OTHER): Payer: 59

## 2020-06-26 ENCOUNTER — Other Ambulatory Visit: Payer: Self-pay

## 2020-06-26 DIAGNOSIS — S92352A Displaced fracture of fifth metatarsal bone, left foot, initial encounter for closed fracture: Secondary | ICD-10-CM

## 2020-06-26 DIAGNOSIS — S92515A Nondisplaced fracture of proximal phalanx of left lesser toe(s), initial encounter for closed fracture: Secondary | ICD-10-CM

## 2020-06-26 DIAGNOSIS — M79676 Pain in unspecified toe(s): Secondary | ICD-10-CM

## 2020-06-26 DIAGNOSIS — S93105A Unspecified dislocation of left toe(s), initial encounter: Secondary | ICD-10-CM

## 2020-06-26 MED ORDER — NAPROXEN 500 MG PO TABS: 500.0000 mg | ORAL_TABLET | Freq: Two times a day (BID) | ORAL | 0 refills | Status: DC

## 2020-06-26 MED ORDER — IBUPROFEN 800 MG PO TABS
800.0000 mg | ORAL_TABLET | Freq: Once | ORAL | Status: AC
  Administered 2020-06-26: 800 mg via ORAL

## 2020-06-26 NOTE — ED Provider Notes (Signed)
[facility]   MRN: [id number] DOB: 1965-12-10  Subjective:   Deanna Jones is a 54 y.o. female presenting for suffering a left pinky toe injury today.  Patient states that she stubbed her toe against floor shoe molding.  She felt immediate pain.  Has been walking with a limp.  No current facility-administered medications for this encounter.  Current Outpatient Medications:  .  ibuprofen (ADVIL,MOTRIN) 200 MG tablet, Take 200 mg by mouth every 6 (six) hours as needed for mild pain or moderate pain. , Disp: , Rfl:  .  cephALEXin (KEFLEX) 500 MG capsule, Take 1 capsule (500 mg total) by mouth 3 (three) times daily. (Patient not taking: No sig reported), Disp: 21 capsule, Rfl: 0 .  naproxen (NAPROSYN) 500 MG tablet, Take 1 tablet (500 mg total) by mouth 2 (two) times daily with a meal., Disp: 20 tablet, Rfl: 2 .  sulfacetamide (BLEPH-10) 10 % ophthalmic solution, Place 2 drops into both eyes 4 (four) times daily., Disp: 5 mL, Rfl: 0 .  sulfamethoxazole-trimethoprim (BACTRIM DS,SEPTRA DS) 800-160 MG per tablet, Take 1 tablet by mouth 2 (two) times daily. (Patient not taking: No sig reported), Disp: 20 tablet, Rfl: 0 .  traMADol (ULTRAM) 50 MG tablet, Take 1 tablet (50 mg total) by mouth every 6 (six) hours as needed for moderate pain. (Patient not taking: No sig reported), Disp: 12 tablet, Rfl: 0   No Known Allergies  History reviewed. No pertinent past medical history.   History reviewed. No pertinent surgical history.  History reviewed. No pertinent family history.  Social History   Tobacco Use  . Smoking status: Current Some Day Smoker    Packs/day: 0.50    Types: Cigarettes  . Smokeless tobacco: Never Used  Vaping Use  . Vaping Use: Never used  Substance Use Topics  . Alcohol use: No  . Drug use: Never    ROS   Objective:   Vitals: BP (!) 148/79 (BP Location: Left Arm)   Pulse 74   Temp 98.2 F (36.8 C) (Oral)   Resp 18   SpO2 96%   Physical  Exam Constitutional:      General: She is not in acute distress.    Appearance: Normal appearance. She is well-developed. She is obese. She is not ill-appearing, toxic-appearing or diaphoretic.  HENT:     Head: Normocephalic and atraumatic.     Nose: Nose normal.     Mouth/Throat:     Mouth: Mucous membranes are moist.     Pharynx: Oropharynx is clear.  Eyes:     General: No scleral icterus.    Extraocular Movements: Extraocular movements intact.     Pupils: Pupils are equal, round, and reactive to light.  Cardiovascular:     Rate and Rhythm: Normal rate.  Pulmonary:     Effort: Pulmonary effort is normal.  Musculoskeletal:       Legs:  Skin:    General: Skin is warm and dry.  Neurological:     General: No focal deficit present.     Mental Status: She is alert and oriented to person, place, and time.  Psychiatric:        Mood and Affect: Mood normal.        Behavior: Behavior normal.        Thought Content: Thought content normal.        Judgment: Judgment normal.    Offered patient Tylenol and ibuprofen for pain but she refused.  States that she  prefers to do things naturally.  DG Toe 5th Left  Result Date: 06/26/2020 CLINICAL DATA:  Stubbed toe EXAM: DG TOE 5TH LEFT COMPARISON:  None. FINDINGS: There is posterior subluxation of the fifth PIP. There is a ossific fragment along the medial aspect of the distal proximal fifth phalanx. Mild irregularity of the proximal base of the middle fifth phalanx could reflect sequela of impaction. Soft tissue swelling. No additional acute fracture or dislocation. IMPRESSION: 1. Posterior subluxation of the fifth PIP joint. 2. Likely avulsion fracture fragment along the medial aspect of the distal proximal fifth phalanx. Electronically Signed   By: Meda Klinefelter MD   On: 06/26/2020 13:53   Local anesthetic applied to the left fifth base of the toe.  Using my fingers I grabbed the distal part of the toe, pulled and applied an upward  pressure to the proximal base of the toe.  Felt a popping sensation and straightening of her toe.  Patient tolerated procedure well.  DG Toe 5th Left  Result Date: 06/26/2020 CLINICAL DATA:  Follow-up reduction of little toe. EXAM: DG TOE 5TH LEFT COMPARISON:  None. FINDINGS: The left little toe PIP joint has been reduced. PIP joint is located on these views. Again noted is an avulsion fraction and probably originating from the medial base of the middle phalanx. IMPRESSION: Little toe PIP joint has been successfully reduced. Avulsion fracture involving the medial base of the little toe middle phalanx. Electronically Signed   By: Richarda Overlie M.D.   On: 06/26/2020 15:06   DG Toe 5th Left  Result Date: 06/26/2020 CLINICAL DATA:  Stubbed toe EXAM: DG TOE 5TH LEFT COMPARISON:  None. FINDINGS: There is posterior subluxation of the fifth PIP. There is a ossific fragment along the medial aspect of the distal proximal fifth phalanx. Mild irregularity of the proximal base of the middle fifth phalanx could reflect sequela of impaction. Soft tissue swelling. No additional acute fracture or dislocation. IMPRESSION: 1. Posterior subluxation of the fifth PIP joint. 2. Likely avulsion fracture fragment along the medial aspect of the distal proximal fifth phalanx. Electronically Signed   By: Meda Klinefelter MD   On: 06/26/2020 13:53    Assessment and Plan :   PDMP not reviewed this encounter.  1. Pain of fifth toe   2. Toe dislocation, left, initial encounter   3. Closed nondisplaced fracture of proximal phalanx of lesser toe of left foot, initial encounter     Subluxation of left fifth toe successfully reduced.  Given her toe fracture, placed her in a postop shoe.  Use naproxen for pain and inflammation.  Follow-up with podiatry. Counseled patient on potential for adverse effects with medications prescribed/recommended today, ER and return-to-clinic precautions discussed, patient verbalized  understanding.    Wallis Bamberg, PA-C 06/26/20 1517

## 2020-06-26 NOTE — Discharge Instructions (Signed)
[facility] ([facility] Podiatrist in [place], [place] COVID-19 info: [email or web address] Get online care: [email or web address] Address: [address], [place], [place] [address] Phone: [phone] Appointments: [email or web address]   [facility], [place], [place] Doctor in [place], [place] Address: [address], , [place] [address] Phone: [phone]  

## 2020-06-26 NOTE — ED Triage Notes (Signed)
Patient stubbed her toe in her foyer this morning injuring her left pinky toe. Pt is aox4 and ambulates with a slight limp.

## 2020-07-01 ENCOUNTER — Other Ambulatory Visit: Payer: Self-pay

## 2020-07-01 ENCOUNTER — Encounter: Payer: Self-pay | Admitting: Podiatry

## 2020-07-01 ENCOUNTER — Ambulatory Visit (INDEPENDENT_AMBULATORY_CARE_PROVIDER_SITE_OTHER): Payer: 59 | Admitting: Podiatry

## 2020-07-01 DIAGNOSIS — S92502A Displaced unspecified fracture of left lesser toe(s), initial encounter for closed fracture: Secondary | ICD-10-CM

## 2020-07-01 NOTE — Patient Instructions (Addendum)
You can use diclofenac gel as needed ----  Toe Fracture A toe fracture is a break in one of the toe bones (phalanges). This may happen if you:  Drop a heavy object on your toe.  Stub your toe.  Twist your toe.  Exercise the same way too much. What are the signs or symptoms? The main symptoms are swelling and pain in the toe. You may also have:  Bruising.  Stiffness.  Numbness.  A change in the way the toe looks.  Broken bones that poke through the skin.  Blood under the toenail. How is this treated? Treatments may include:  Taping the broken toe to a toe that is next to it (buddy taping).  Wearing a shoe that has a wide, rigid sole to protect the toe and to limit its movement.  Wearing a cast.  Surgery. This may be needed if the: ? Pieces of broken bone are out of place. ? Bone pokes through the skin.  Physical therapy. Follow these instructions at home: If you have a shoe:  Wear the shoe as told by your doctor. Remove it only as told by your doctor.  Loosen the shoe if your toes tingle, become numb, or turn cold and blue.  Keep the shoe clean and dry. If you have a cast:  Do not put pressure on any part of the cast until it is fully hardened. This may take a few hours.  Do not stick anything inside the cast to scratch your skin.  Check the skin around the cast every day. Tell your doctor about any concerns.  You may put lotion on dry skin around the edges of the cast.  Do not put lotion on the skin under the cast.  Keep the cast clean and dry. Bathing  Do not take baths, swim, or use a hot tub until your doctor says it is okay. Ask your doctor if you can take showers.  If the shoe or cast is not waterproof: ? Do not let it get wet. ? Cover it with a watertight covering when you take a bath or a shower. Activity  Do not use your foot to support your body weight until your doctor says it is okay.  Use crutches as told by your doctor.  Ask your  doctor what activities are safe for you during recovery.  Avoid activities as told by your doctor.  Do exercises as told by your doctor or therapist. Driving  Do not drive or use heavy machinery while taking pain medicine.  Do not drive while wearing a cast on a foot that you use for driving. Managing pain, stiffness, and swelling   Put ice on the injured area if told by your doctor: ? Put ice in a plastic bag. ? Place a towel between your skin and the bag.  If you have a shoe, remove it as told by your doctor.  If you have a cast, place a towel between your cast and the bag. ? Leave the ice on for 20 minutes, 2-3 times per day.  Raise (elevate) the injured area above the level of your heart while you are sitting or lying down. General instructions  If your toe was taped to a toe that is next to it, follow your doctor's instructions for changing the gauze and tape. Change it more often: ? If the gauze and tape get wet. If this happens, dry the space between the toes. ? If the gauze and tape are too  tight and they cause your toe to become pale or to lose feeling (go numb).  If your doctor did not give you a protective shoe, wear sturdy shoes that support your foot. Your shoes should not: ? Pinch your toes. ? Fit tightly against your toes.  Do not use any tobacco products, including cigarettes, chewing tobacco, or e-cigarettes. These can delay bone healing. If you need help quitting, ask your doctor.  Take medicines only as told by your doctor.  Keep all follow-up visits as told by your doctor. This is important. Contact a doctor if:  Your pain medicine is not helping.  You have a fever.  You notice a bad smell coming from your cast. Get help right away if:  You lose feeling (have numbness) in your toe or foot, and it is getting worse.  Your toe or your foot tingles.  Your toe or your foot gets cold or turns blue.  You have redness or swelling in your toe or foot,  and it is getting worse.  You have very bad pain. Summary  A toe fracture is a break in one of the toe bones.  Use ice and raise your foot. This will help lessen pain and swelling.  Use crutches as told by your doctor. This information is not intended to replace advice given to you by your health care provider. Make sure you discuss any questions you have with your health care provider. Document Revised: 08/30/2017 Document Reviewed: 08/07/2017 Elsevier Patient Education  2020 [employer].

## 2020-07-07 NOTE — Progress Notes (Signed)
Subjective:   Patient ID: Deanna Jones, female   DOB: 54 y.o.   MRN: [id number]   HPI 54 year old female presents the office today for concerns of left fifth toe injury which occurred on December 18.  She is been wearing surgical shoe.  She was seen in the emergency department after she had a closed reduction of the fifth toe.  She states that she been doing well she is actually on her feet quite a bit recently without any significant pain.  She has no other concerns today.    Review of Systems  All other systems reviewed and are negative.  History reviewed. No pertinent past medical history.  History reviewed. No pertinent surgical history.  No current outpatient medications on file.  No Known Allergies        Objective:  Physical Exam  General: AAO x3, NAD  Dermatological: Skin is warm, dry and supple bilateral. There are no open sores, no preulcerative lesions, no rash or signs of infection present.  Vascular: Dorsalis Pedis artery and Posterior Tibial artery pedal pulses are 2/4 bilateral with immedate capillary fill time. There is no pain with calf compression, swelling, warmth, erythema.   Neruologic: Grossly intact via light touch bilateral.  Musculoskeletal: There is mild edema present left fifth toe with minimal pain.  There is no pain in the metatarsal other digits.  The toes in rectus position.  Muscular strength 5/5 in all groups tested bilateral.  Gait: Unassisted, Nonantalgic.       Assessment:   Left fifth toe fracture, dislocation     Plan:  -Treatment options discussed including all alternatives, risks, and complications -Etiology of symptoms were discussed -I independently reviewed the x-rays from the emergency department.   -At this time I like recommend surgical shoe the next couple of weeks to lateral ligaments as well as the fracture to heal better.  Discussed in about 2 weeks she can start to transition back to regular shoe as tolerated as  long as there is no pain.  Discussed wearing stiffer soled shoe.  Continue ice elevation.  Deanna Jones DPM

## 2020-07-21 ENCOUNTER — Other Ambulatory Visit: Payer: 59

## 2020-07-21 DIAGNOSIS — Z20822 Contact with and (suspected) exposure to covid-19: Secondary | ICD-10-CM

## 2020-07-23 LAB — NOVEL CORONAVIRUS, NAA: SARS-CoV-2, NAA: NOT DETECTED

## 2020-07-23 LAB — SARS-COV-2, NAA 2 DAY TAT

## 2020-07-29 ENCOUNTER — Ambulatory Visit (INDEPENDENT_AMBULATORY_CARE_PROVIDER_SITE_OTHER): Payer: 59

## 2020-07-29 ENCOUNTER — Ambulatory Visit (INDEPENDENT_AMBULATORY_CARE_PROVIDER_SITE_OTHER): Payer: 59 | Admitting: Podiatry

## 2020-07-29 ENCOUNTER — Other Ambulatory Visit: Payer: Self-pay

## 2020-07-29 DIAGNOSIS — S92502D Displaced unspecified fracture of left lesser toe(s), subsequent encounter for fracture with routine healing: Secondary | ICD-10-CM | POA: Diagnosis not present

## 2020-07-29 DIAGNOSIS — S99922D Unspecified injury of left foot, subsequent encounter: Secondary | ICD-10-CM

## 2020-07-29 DIAGNOSIS — S92502A Displaced unspecified fracture of left lesser toe(s), initial encounter for closed fracture: Secondary | ICD-10-CM

## 2020-08-04 NOTE — Progress Notes (Signed)
Subjective: 55 year old female presents the office today for follow-up evaluation of left fifth toe fracture. She states that overall she has been doing well not having any significant discomfort. She is been wearing a surgical shoe. No significant swelling. No recent injury or fall since I last saw her no changes otherwise. Denies any systemic complaints such as fevers, chills, nausea, vomiting. No acute changes since last appointment, and no other complaints at this time.   Objective: AAO x3, NAD DP/PT pulses palpable bilaterally, CRT less than 3 seconds There is no tenderness palpation particular the left fifth toe there is no pain the other digits metatarsals. There is minimal edema to the toe and there is no erythema or warmth. No open lesions. Adductovarus is noted. No other areas of discomfort identified today. MMT 5/5. No pain with calf compression, swelling, warmth, erythema  Assessment: Left fifth toe fracture with improvement  Plan: -All treatment options discussed with the patient including all alternatives, risks, complications.  -Repeat x-rays obtained reviewed. Avulsion fracture noted the fifth digit but overall increased consolidation across the fracture site. -At this point she is doing well and significant discomfort. Discussed gradual transition back to her regular shoe. There is any increase in pain to return to the surgical shoe to let me know. -Patient encouraged to call the office with any questions, concerns, change in symptoms.   Vivi Barrack DPM

## 2020-08-12 ENCOUNTER — Other Ambulatory Visit: Payer: Self-pay

## 2020-08-12 DIAGNOSIS — Z20822 Contact with and (suspected) exposure to covid-19: Secondary | ICD-10-CM

## 2020-08-13 LAB — NOVEL CORONAVIRUS, NAA: SARS-CoV-2, NAA: NOT DETECTED

## 2020-08-13 LAB — SARS-COV-2, NAA 2 DAY TAT

## 2020-08-18 ENCOUNTER — Other Ambulatory Visit: Payer: Self-pay

## 2020-08-18 DIAGNOSIS — Z20822 Contact with and (suspected) exposure to covid-19: Secondary | ICD-10-CM

## 2020-08-19 LAB — NOVEL CORONAVIRUS, NAA: SARS-CoV-2, NAA: DETECTED — AB

## 2020-08-19 LAB — SARS-COV-2, NAA 2 DAY TAT

## 2020-08-20 ENCOUNTER — Telehealth: Payer: Self-pay

## 2020-08-20 NOTE — Telephone Encounter (Signed)
Called to discuss with patient about COVID-19 symptoms and the use of one of the available treatments for those with mild to moderate Covid symptoms and at a high risk of hospitalization.  Pt appears to qualify for outpatient treatment due to co-morbid conditions and/or a member of an at-risk group in accordance with the FDA Emergency Use Authorization.    Symptom onset: Cough - 08/16/20 Vaccinated: Yes Booster? No Immunocompromised? No Qualifiers: None  Reports her symptoms are mild.  Deanna Jones

## 2020-08-25 ENCOUNTER — Other Ambulatory Visit: Payer: Self-pay

## 2020-08-25 DIAGNOSIS — Z20822 Contact with and (suspected) exposure to covid-19: Secondary | ICD-10-CM

## 2020-08-26 LAB — SARS-COV-2, NAA 2 DAY TAT

## 2020-08-26 LAB — NOVEL CORONAVIRUS, NAA

## 2021-06-13 ENCOUNTER — Ambulatory Visit
Admission: EM | Admit: 2021-06-13 | Discharge: 2021-06-13 | Disposition: A | Discharge status: Home / Self Care | Payer: 59 | Attending: Physician Assistant | Admitting: Physician Assistant

## 2021-06-13 ENCOUNTER — Other Ambulatory Visit: Payer: Self-pay

## 2021-06-13 DIAGNOSIS — J069 Acute upper respiratory infection, unspecified: Secondary | ICD-10-CM

## 2021-06-13 NOTE — ED Provider Notes (Signed)
[facility]    CSN: [id number] Arrival date & time: 06/13/21  1045      History   Chief Complaint Chief Complaint  Patient presents with   Generalized Body Aches   Cough    HPI Deanna Jones is a 55 y.o. female.   Patient here today for evaluation of nasal congestion and drainage, cough, body aches, chills and diarrhea that started today. She has had exposure to flu. She has tried OTC meds with mild relief.   The history is provided by the patient.   History reviewed. No pertinent past medical history.  There are no problems to display for this patient.   History reviewed. No pertinent surgical history.  OB History   No obstetric history on file.      Home Medications    Prior to Admission medications   Not on File    Family History No family history on file.  Social History Social History   Tobacco Use   Smoking status: Some Days    Packs/day: 0.50    Types: Cigarettes   Smokeless tobacco: Never  Vaping Use   Vaping Use: Never used  Substance Use Topics   Alcohol use: No   Drug use: Never     Allergies   Patient has no known allergies.   Review of Systems Review of Systems  Constitutional:  Positive for chills and fever (subjective).  HENT:  Positive for congestion and sore throat. Negative for ear pain.   Eyes:  Positive for discharge (tearing). Negative for redness.  Respiratory:  Positive for cough. Negative for shortness of breath and wheezing.   Gastrointestinal:  Positive for diarrhea. Negative for nausea and vomiting.    Physical Exam Triage Vital Signs ED Triage Vitals  Enc Vitals Group     BP      Pulse      Resp      Temp      Temp src      SpO2      Weight      Height      Head Circumference      Peak Flow      Pain Score      Pain Loc      Pain Edu?      Excl. in GC?    No data found.  Updated Vital Signs BP 111/74 (BP Location: Left Arm)   Pulse 77   Temp 98.7 F (37.1 C) (Oral)    Resp 18   Ht 5\' 1"  (1.549 m)   Wt 175 lb (79.4 kg)   SpO2 95%   BMI 33.07 kg/m      Physical Exam Vitals and nursing note reviewed.  Constitutional:      General: She is not in acute distress.    Appearance: Normal appearance. She is not ill-appearing.  HENT:     Head: Normocephalic and atraumatic.     Nose: Congestion present.     Mouth/Throat:     Mouth: Mucous membranes are moist.     Pharynx: No oropharyngeal exudate or posterior oropharyngeal erythema.  Eyes:     Conjunctiva/sclera: Conjunctivae normal.  Cardiovascular:     Rate and Rhythm: Normal rate and regular rhythm.     Heart sounds: Normal heart sounds. No murmur heard. Pulmonary:     Effort: Pulmonary effort is normal. No respiratory distress.     Breath sounds: Normal breath sounds. No wheezing, rhonchi or rales.  Skin:  General: Skin is warm and dry.  Neurological:     Mental Status: She is alert.  Psychiatric:        Mood and Affect: Mood normal.        Thought Content: Thought content normal.     UC Treatments / Results  Labs (all labs ordered are listed, but only abnormal results are displayed) Labs Reviewed  COVID-19, FLU A+B NAA    EKG   Radiology No results found.  Procedures Procedures (including critical care time)  Medications Ordered in UC Medications - No data to display  Initial Impression / Assessment and Plan / UC Course  I have reviewed the triage vital signs and the nursing notes.  Pertinent labs & imaging results that were available during my care of the patient were reviewed by me and considered in my medical decision making (see chart for details).    Suspect viral etiology of symptoms. Will order covid and flu screening. Recommend symptomatic treatment and follow up with any concerns.   Final Clinical Impressions(s) / UC Diagnoses   Final diagnoses:  Upper respiratory tract infection, unspecified type   Discharge Instructions   None    ED Prescriptions    None    PDMP not reviewed this encounter.   Tomi Bamberger, PA-C 06/13/21 1217

## 2021-06-13 NOTE — ED Triage Notes (Signed)
Pt c/o cold chills, diarrhea, cough, congestion, nasal drainage, body aches. Pt works at [employer] and has been around flu patients.

## 2021-06-14 LAB — COVID-19, FLU A+B NAA
Influenza A, NAA: DETECTED — AB
Influenza B, NAA: NOT DETECTED
SARS-CoV-2, NAA: NOT DETECTED

## 2021-12-30 IMAGING — DX DG TOE 5TH 2+V*L*
3 series · 3 of 3 positions shown · non-contrast
Comparison: None.

CLINICAL DATA: Follow-up reduction of little toe.

EXAM:
DG TOE 5TH LEFT

[toes dp (1 of 2)]
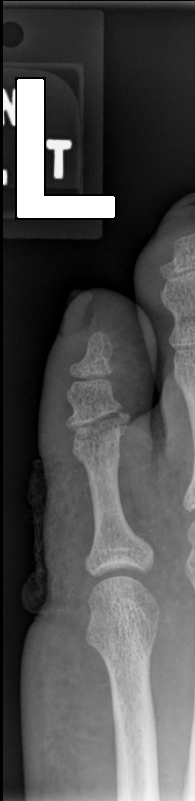

[toes dp (2 of 2)]
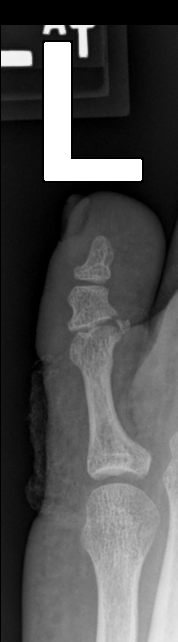

[toes lat]
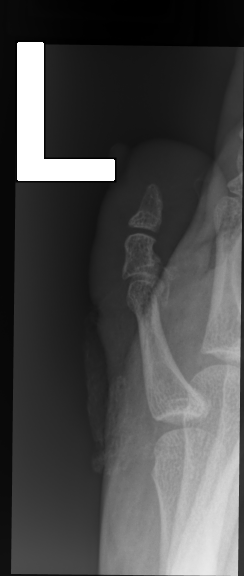

[3 of 3 positions shown; findings below may reference images not displayed]

FINDINGS: The left little toe PIP joint has been reduced. PIP joint is located
on these views. Again noted is an avulsion fraction and probably
originating from the medial base of the middle phalanx.
IMPRESSION: Little toe PIP joint has been successfully reduced.

Avulsion fracture involving the medial base of the little toe middle
phalanx.

## 2022-04-09 ENCOUNTER — Other Ambulatory Visit: Payer: Self-pay

## 2022-04-09 ENCOUNTER — Emergency Department
Admission: EM | Admit: 2022-04-09 | Discharge: 2022-04-09 | Disposition: A | Discharge status: Home / Self Care | Payer: Self-pay | Attending: Emergency Medicine | Admitting: Emergency Medicine

## 2022-04-09 ENCOUNTER — Ambulatory Visit: Payer: Self-pay

## 2022-04-09 DIAGNOSIS — R1032 Left lower quadrant pain: Secondary | ICD-10-CM | POA: Insufficient documentation

## 2022-04-09 DIAGNOSIS — R110 Nausea: Secondary | ICD-10-CM | POA: Insufficient documentation

## 2022-04-09 LAB — CBC
HCT: 43.7 % (ref 36.0–46.0)
Hemoglobin: 14.2 g/dL (ref 12.0–15.0)
MCH: 31 pg (ref 26.0–34.0)
MCHC: 32.5 g/dL (ref 30.0–36.0)
MCV: 95.4 fL (ref 80.0–100.0)
Platelets: 415 10*3/uL — ABNORMAL HIGH (ref 150–400)
RBC: 4.58 MIL/uL (ref 3.87–5.11)
RDW: 12.3 % (ref 11.5–15.5)
WBC: 7.9 10*3/uL (ref 4.0–10.5)
nRBC: 0 % (ref 0.0–0.2)

## 2022-04-09 LAB — COMPREHENSIVE METABOLIC PANEL
ALT: 24 U/L (ref 0–44)
AST: 22 U/L (ref 15–41)
Albumin: 4.3 g/dL (ref 3.5–5.0)
Alkaline Phosphatase: 75 U/L (ref 38–126)
Anion gap: 5 (ref 5–15)
BUN: 9 mg/dL (ref 6–20)
CO2: 25 mmol/L (ref 22–32)
Calcium: 9.2 mg/dL (ref 8.9–10.3)
Chloride: 111 mmol/L (ref 98–111)
Creatinine, Ser: 0.65 mg/dL (ref 0.44–1.00)
GFR, Estimated: >60 mL/min (ref >60)
Glucose, Bld: 115 mg/dL — ABNORMAL HIGH (ref 70–99)
Potassium: 3.5 mmol/L (ref 3.5–5.1)
Sodium: 141 mmol/L (ref 135–145)
Total Bilirubin: 0.8 mg/dL (ref 0.3–1.2)
Total Protein: 7.9 g/dL (ref 6.5–8.1)

## 2022-04-09 LAB — URINALYSIS, ROUTINE W REFLEX MICROSCOPIC
Bacteria, UA: NONE SEEN
Bilirubin Urine: NEGATIVE
Glucose, UA: NEGATIVE mg/dL
Hgb urine dipstick: NEGATIVE
Ketones, ur: NEGATIVE mg/dL
Leukocytes,Ua: NEGATIVE
Nitrite: NEGATIVE
Protein, ur: 30 mg/dL — AB
Specific Gravity, Urine: 1.024 (ref 1.005–1.030)
pH: 7 (ref 5.0–8.0)

## 2022-04-09 LAB — LIPASE, BLOOD: Lipase: 27 U/L (ref 11–51)

## 2022-04-09 MED ORDER — ONDANSETRON HCL 4 MG PO TABS: 4.0000 mg | ORAL_TABLET | Freq: Three times a day (TID) | ORAL | 0 refills | Status: DC | PRN

## 2022-04-09 MED ORDER — ONDANSETRON 4 MG PO TBDP
4.0000 mg | ORAL_TABLET | Freq: Once | ORAL | Status: AC
  Administered 2022-04-09: 4 mg via ORAL
  Filled 2022-04-09: qty 1

## 2022-10-21 ENCOUNTER — Ambulatory Visit
Admission: EM | Admit: 2022-10-21 | Discharge: 2022-10-21 | Disposition: A | Discharge status: Home / Self Care | Payer: No Typology Code available for payment source | Attending: Emergency Medicine | Admitting: Emergency Medicine

## 2022-10-21 DIAGNOSIS — R102 Pelvic and perineal pain: Secondary | ICD-10-CM | POA: Diagnosis present

## 2022-10-21 DIAGNOSIS — Z113 Encounter for screening for infections with a predominantly sexual mode of transmission: Secondary | ICD-10-CM | POA: Insufficient documentation

## 2022-10-21 NOTE — ED Triage Notes (Signed)
Patient presents at [facility] with vaginal pain that started today.

## 2022-10-21 NOTE — ED Provider Notes (Signed)
[facility]   [id number] 10/21/22 Arrival Time: 1119   Chief Complaint  Patient presents with   Vaginal Pain     SUBJECTIVE:  Deanna Jones is a 57 y.o. female resents to the urgent care for complaint of vaginal pain and like to have STD screening completed.  She reports her husband has been cheating other woman.  She would like to have STD screening.  She denies any other aggravating factors.  Reports pain is intermittent. She denies vaginal discharge.  Denies similar symptoms in the past.  She denies fever, chills, nausea, vomiting, abdominal or pelvic pain, urinary symptoms, vaginal itching, vaginal odor, vaginal bleeding, dyspareunia, vaginal rashes or lesions.   No LMP recorded. Patient is perimenopausal. Current birth control method: Compliant with BC:  ROS: As per HPI.  All other pertinent ROS negative.     No past medical history on file. Past Surgical History:  Procedure Laterality Date   ANKLE SURGERY  2013   No Known Allergies No current facility-administered medications on file prior to encounter.   Current Outpatient Medications on File Prior to Encounter  Medication Sig Dispense Refill   ondansetron (ZOFRAN) 4 MG tablet Take 1 tablet (4 mg total) by mouth every 8 (eight) hours as needed for vomiting or nausea. 15 tablet 0    Social History   Socioeconomic History   Marital status: Legally Separated    Spouse name: Not on file   Number of children: Not on file   Years of education: Not on file   Highest education level: Not on file  Occupational History   Not on file  Tobacco Use   Smoking status: Former    Packs/day: 1.00    Years: 25.00    Additional pack years: 0.00    Total pack years: 25.00    Types: Cigarettes    Quit date: 2016    Years since quitting: 8.2   Smokeless tobacco: Never  Vaping Use   Vaping Use: Never used  Substance and Sexual Activity   Alcohol use: Not Currently   Drug use: Never   Sexual activity: Not  Currently    Comment: since 2020  Other Topics Concern   Not on file  Social History Narrative   ** Merged History Encounter **       Social Determinants of Health   Financial Resource Strain: Not on file  Food Insecurity: Not on file  Transportation Needs: Not on file  Physical Activity: Not on file  Stress: Not on file  Social Connections: Not on file  Intimate Partner Violence: Not on file   No family history on file.  OBJECTIVE:  Vitals:   10/21/22 1145  BP: (!) 150/76  Pulse: (!) 42  Resp: 17  Temp: 97.6 F (36.4 C)  SpO2: 96%     General appearance: Alert, NAD, appears stated age Head: NCAT Throat: lips, mucosa, and tongue normal; teeth and gums normal Lungs: CTA bilaterally without adventitious breath sounds Heart: regular rate and rhythm.  Radial pulses 2+ symmetrical bilaterally Back: no CVA tenderness Abdomen: soft, non-tender; bowel sounds normal; no masses or organomegaly; no guarding or rebound tenderness GU: self Cervical swab obtained.  Vaginal exam deferred Skin: warm and dry Psychological:  Alert and cooperative. Normal mood and affect.  LABS:  Results for orders placed or performed during the hospital encounter of 04/09/22  Lipase, blood  Result Value Ref Range   Lipase 27 11 - 51 U/L  Comprehensive metabolic panel  Result Value Ref Range   Sodium 141 135 - 145 mmol/L   Potassium 3.5 3.5 - 5.1 mmol/L   Chloride 111 98 - 111 mmol/L   CO2 25 22 - 32 mmol/L   Glucose, Bld 115 (H) 70 - 99 mg/dL   BUN 9 6 - 20 mg/dL   Creatinine, Ser 1.61 0.44 - 1.00 mg/dL   Calcium 9.2 8.9 - 09.6 mg/dL   Total Protein 7.9 6.5 - 8.1 g/dL   Albumin 4.3 3.5 - 5.0 g/dL   AST 22 15 - 41 U/L   ALT 24 0 - 44 U/L   Alkaline Phosphatase 75 38 - 126 U/L   Total Bilirubin 0.8 0.3 - 1.2 mg/dL   GFR, Estimated >04 >54 mL/min   Anion gap 5 5 - 15  CBC  Result Value Ref Range   WBC 7.9 4.0 - 10.5 K/uL   RBC 4.58 3.87 - 5.11 MIL/uL   Hemoglobin 14.2 12.0 - 15.0 g/dL    HCT 09.8 11.9 - 14.7 %   MCV 95.4 80.0 - 100.0 fL   MCH 31.0 26.0 - 34.0 pg   MCHC 32.5 30.0 - 36.0 g/dL   RDW 82.9 56.2 - 13.0 %   Platelets 415 (H) 150 - 400 K/uL   nRBC 0.0 0.0 - 0.2 %  Urinalysis, Routine w reflex microscopic  Result Value Ref Range   Color, Urine YELLOW (A) YELLOW   APPearance CLEAR (A) CLEAR   Specific Gravity, Urine 1.024 1.005 - 1.030   pH 7.0 5.0 - 8.0   Glucose, UA NEGATIVE NEGATIVE mg/dL   Hgb urine dipstick NEGATIVE NEGATIVE   Bilirubin Urine NEGATIVE NEGATIVE   Ketones, ur NEGATIVE NEGATIVE mg/dL   Protein, ur 30 (A) NEGATIVE mg/dL   Nitrite NEGATIVE NEGATIVE   Leukocytes,Ua NEGATIVE NEGATIVE   RBC / HPF 0-5 0 - 5 RBC/hpf   WBC, UA 0-5 0 - 5 WBC/hpf   Bacteria, UA NONE SEEN NONE SEEN   Squamous Epithelial / HPF 0-5 0 - 5   Mucus PRESENT     Labs Reviewed - No data to display  ASSESSMENT & PLAN:  1. Vaginal pain   2. Screening for STD (sexually transmitted disease)     No orders of the defined types were placed in this encounter.   Pending: Labs Reviewed - No data to display  Discharge instructions  Vaginal self-swab obtained.  We will follow up with you regarding abnormal results If STD screening is negative please follow-up with OB/GYN May use OTC Tylenol/ibuprofen as needed for pain If tests results are positive, please abstain from sexual activity until you and your partner(s) have been treated Follow up with PCP or [facility] if symptoms persists Return here or go to ER if you have any new or worsening symptoms fever, chills, nausea, vomiting, abdominal or pelvic pain, painful intercourse, vaginal discharge, vaginal bleeding, persistent symptoms despite treatment, etc...  Reviewed expectations re: course of current medical issues. Questions answered. Outlined signs and symptoms indicating need for more acute intervention. Patient verbalized understanding. After Visit Summary given.        Durward Parcel,  FNP 10/21/22 1208

## 2022-10-21 NOTE — Discharge Instructions (Addendum)
Vaginal self-swab obtained.  We will follow up with you regarding abnormal results If STD screening is negative please follow-up with OB/GYN May use OTC Tylenol/ibuprofen as needed for pain If tests results are positive, please abstain from sexual activity until you and your partner(s) have been treated Follow up with PCP or [facility] if symptoms persists Return here or go to ER if you have any new or worsening symptoms fever, chills, nausea, vomiting, abdominal or pelvic pain, painful intercourse, vaginal discharge, vaginal bleeding, persistent symptoms despite treatment, etc..Marland Kitchen

## 2022-10-24 LAB — CERVICOVAGINAL ANCILLARY ONLY
Bacterial Vaginitis (gardnerella): NEGATIVE
Candida Glabrata: NEGATIVE
Candida Vaginitis: NEGATIVE
Chlamydia: NEGATIVE
Comment: NEGATIVE
Comment: NEGATIVE
Comment: NEGATIVE
Comment: NEGATIVE
Comment: NEGATIVE
Comment: NORMAL
Neisseria Gonorrhea: NEGATIVE
Trichomonas: NEGATIVE

## 2023-05-20 ENCOUNTER — Ambulatory Visit
Admission: EM | Admit: 2023-05-20 | Discharge: 2023-05-20 | Disposition: A | Discharge status: Home / Self Care | Payer: 59 | Attending: Internal Medicine | Admitting: Internal Medicine

## 2023-05-20 ENCOUNTER — Other Ambulatory Visit: Payer: Self-pay | Admitting: Physician Assistant

## 2023-05-20 DIAGNOSIS — R21 Rash and other nonspecific skin eruption: Secondary | ICD-10-CM | POA: Diagnosis not present

## 2023-05-20 DIAGNOSIS — R001 Bradycardia, unspecified: Secondary | ICD-10-CM | POA: Diagnosis not present

## 2023-05-20 MED ORDER — TRIAMCINOLONE ACETONIDE 0.1 % EX CREA: 1.0000 | TOPICAL_CREAM | Freq: Two times a day (BID) | CUTANEOUS | 0 refills | Status: DC

## 2023-05-20 NOTE — ED Triage Notes (Signed)
"  I have bumps/hives on various areas on my upper arms". "The only new thing I have been exposed to is a new Gatorade". No trouble breathing. No respiratory distress.

## 2023-05-20 NOTE — ED Notes (Signed)
Provider notified to come to room due to vital signs.

## 2023-05-20 NOTE — ED Provider Notes (Signed)
[facility]    CSN: [id number] Arrival date & time: 05/20/23  1401      History   Chief Complaint Chief Complaint  Patient presents with   Skin Problem    HPI Deanna Jones is a 57 y.o. female.   HPI  History reviewed. No pertinent past medical history.  There are no problems to display for this patient.   Past Surgical History:  Procedure Laterality Date   ANKLE SURGERY  2013    OB History   No obstetric history on file.      Home Medications    Prior to Admission medications   Medication Sig Start Date End Date Taking? Authorizing Provider  Cocoa Butter CREA by Does not apply route.   Yes [provider]  diphenhydrAMINE (BENADRYL) 25 mg capsule Take 25 mg by mouth every 6 (six) hours as needed for itching, allergies or sleep.   Yes [provider]  Pramoxine-Calamine (AVEENO ANTI-ITCH EX) Apply topically.   Yes [provider]  ondansetron (ZOFRAN) 4 MG tablet Take 1 tablet (4 mg total) by mouth every 8 (eight) hours as needed for vomiting or nausea. 04/09/22   Phineas Semen, MD    Family History History reviewed. No pertinent family history.  Social History Social History   Tobacco Use   Smoking status: Former    Current packs/day: 0.00    Average packs/day: 1 pack/day for 25.0 years (25.0 ttl pk-yrs)    Types: Cigarettes    Start date: 84    Quit date: 2016    Years since quitting: 8.8    Passive exposure: Current   Smokeless tobacco: Never  Vaping Use   Vaping status: Never Used  Substance Use Topics   Alcohol use: Not Currently   Drug use: Never     Allergies   Patient has no known allergies.   Review of Systems Review of Systems   Physical Exam Triage Vital Signs ED Triage Vitals  Encounter Vitals Group     BP 05/20/23 1437 (!) 150/79     Systolic BP Percentile --      Diastolic BP Percentile --      Pulse Rate 05/20/23 1437 (!) 35     Resp 05/20/23 1437 18     Temp  05/20/23 1437 97.8 F (36.6 C)     Temp Source 05/20/23 1437 Oral     SpO2 05/20/23 1437 98 %     Weight 05/20/23 1434 193 lb (87.5 kg)     Height 05/20/23 1434 5\' 1"  (1.549 m)     Head Circumference --      Peak Flow --      Pain Score 05/20/23 1432 0     Pain Loc --      Pain Education --      Exclude from Growth Chart --    No data found.  Updated Vital Signs BP (!) 148/77 (BP Location: Left Arm)   Pulse (!) 43   Temp 97.8 F (36.6 C) (Oral)   Resp 18   Ht 5\' 1"  (1.549 m)   Wt 87.5 kg   LMP  (LMP Unknown)   SpO2 100%   BMI 36.47 kg/m   Visual Acuity Right Eye Distance:   Left Eye Distance:   Bilateral Distance:    Right Eye Near:   Left Eye Near:    Bilateral Near:     Physical Exam   UC Treatments / Results  Labs (all labs  ordered are listed, but only abnormal results are displayed) Labs Reviewed - No data to display  EKG   Radiology No results found.  Procedures Procedures (including critical care time)  Medications Ordered in UC Medications - No data to display  Initial Impression / Assessment and Plan / UC Course  I have reviewed the triage vital signs and the nursing notes.  Pertinent labs & imaging results that were available during my care of the patient were reviewed by me and considered in my medical decision making (see chart for details).     *** Final Clinical Impressions(s) / UC Diagnoses   Final diagnoses:  None   Discharge Instructions   None    ED Prescriptions   None    PDMP not reviewed this encounter.

## 2023-05-20 NOTE — Discharge Instructions (Signed)
The cardiologist wall send you had monitoring machine with instructions.  They will let you know when you need to return to see them. If you have any dizziness, feeling faint, passout, short of breath, chest pain or palpitations please go to the emergency room to be evaluated.

## 2023-05-20 NOTE — Progress Notes (Signed)
ON-CALL CARDIOLOGY 05/20/23  Patient's name: Piper Marcial.   MRN: [id number].    DOB: May 07, 1966 Primary care provider: Pcp, No. Primary cardiologist: None   Referring physician: Dr. Leonides Grills  Interaction regarding this patient's care today: Dr. Leonides Grills from [facility] urgent care called to discuss her case.   She presented to UC w/ cc "rash."  When the nursing staff went to go do vital signs she was noted to have bradycardia w/ pulse in high 30s to low 40s per referring physician. She is asymptomatic on her phone.   No symptoms of CP, Shortness of breath, near syncope, syncope.   EKG was performed - cardiology was reached out.   No prior EKG for review - but referring physician notes that she is suppose to follow up as outpatient but never did.   Impression: Bradycardia - asymptomatic   Recommendations: Zio patch and follow up appt to discuss results.  She is asymptomatic per UC provider.  No near syncope or syncope.   Telephone encounter total time: 10 minutes.   Spoke to [facility] provider, reviewed labs / EKG, formulation of the care plan, ordering outpatient testing.   Tessa Lerner, DO, [facility]  [facility]  [address] [place], [place] [address] 05/20/2023 6:50 PM

## 2023-05-20 NOTE — Progress Notes (Signed)
Patient came to the ER for hives, was noted to have significant bradycardia and heart block.  7-day monitor requested, nonlife, can be mailed to patient.  Dr. Odis Hollingshead has agreed to reviewed

## 2023-05-20 NOTE — ED Notes (Signed)
F/u instructions reviewed with pt and her husband to f/u with Cardiology in the morning. They verbalized understanding to seek emergency care if she has any worsening symptoms. Pt alert- denies dizziness, sob, chest pain at discharge.

## 2023-05-20 NOTE — ED Notes (Signed)
Patient ambulating to bathroom (no distress). Will pull cord if assistance is need and/or for emergency.  B. Roten CMA

## 2023-05-21 ENCOUNTER — Ambulatory Visit: Payer: 59 | Attending: Physician Assistant

## 2023-05-21 DIAGNOSIS — R001 Bradycardia, unspecified: Secondary | ICD-10-CM

## 2023-05-21 NOTE — Progress Notes (Unsigned)
Enrolled for [employer] to mail a ZIO XT long term holter monitor to the patients address on file.   Dr. Odis Hollingshead to read.  Applied [id number] to patient in the office

## 2023-05-22 LAB — CBC WITH DIFFERENTIAL/PLATELET
Basophils Absolute: 0.1 10*3/uL (ref 0.0–0.2)
Basos: 1 %
EOS (ABSOLUTE): 0.2 10*3/uL (ref 0.0–0.4)
Eos: 3 %
Hematocrit: 46 % (ref 34.0–46.6)
Hemoglobin: 14.5 g/dL (ref 11.1–15.9)
Immature Grans (Abs): 0 10*3/uL (ref 0.0–0.1)
Immature Granulocytes: 0 %
Lymphocytes Absolute: 3 10*3/uL (ref 0.7–3.1)
Lymphs: 40 %
MCH: 30.5 pg (ref 26.6–33.0)
MCHC: 31.5 g/dL (ref 31.5–35.7)
MCV: 97 fL (ref 79–97)
Monocytes Absolute: 0.7 10*3/uL (ref 0.1–0.9)
Monocytes: 9 %
Neutrophils Absolute: 3.6 10*3/uL (ref 1.4–7.0)
Neutrophils: 47 %
Platelets: 387 10*3/uL (ref 150–450)
RBC: 4.75 x10E6/uL (ref 3.77–5.28)
RDW: 11.4 % — ABNORMAL LOW (ref 11.7–15.4)
WBC: 7.7 10*3/uL (ref 3.4–10.8)

## 2023-05-22 LAB — COMPREHENSIVE METABOLIC PANEL
ALT: 27 [IU]/L (ref 0–32)
AST: 20 [IU]/L (ref 0–40)
Albumin: 4.3 g/dL (ref 3.8–4.9)
Alkaline Phosphatase: 92 [IU]/L (ref 44–121)
BUN/Creatinine Ratio: 17 (ref 9–23)
BUN: 12 mg/dL (ref 6–24)
Bilirubin Total: 0.3 mg/dL (ref 0.0–1.2)
CO2: 23 mmol/L (ref 20–29)
Calcium: 10.1 mg/dL (ref 8.7–10.2)
Chloride: 104 mmol/L (ref 96–106)
Creatinine, Ser: 0.72 mg/dL (ref 0.57–1.00)
Globulin, Total: 3 g/dL (ref 1.5–4.5)
Glucose: 94 mg/dL (ref 70–99)
Potassium: 4.5 mmol/L (ref 3.5–5.2)
Sodium: 142 mmol/L (ref 134–144)
Total Protein: 7.3 g/dL (ref 6.0–8.5)
eGFR: 98 mL/min/{1.73_m2} (ref >59)

## 2023-05-22 LAB — TSH: TSH: 3.15 u[IU]/mL (ref 0.450–4.500)

## 2023-05-25 ENCOUNTER — Telehealth: Payer: Self-pay | Admitting: Cardiology

## 2023-05-25 NOTE — Telephone Encounter (Signed)
Spoke with patient and she wanted to know what can she take for gas. She states she has been having a lot of gas. She does not have chest pain. She would like to know what you recommend? Did advise to contact PCP regarding her concerns for gas.

## 2023-05-25 NOTE — Telephone Encounter (Signed)
Patient wants to know if she can take Gas X for her chest pains.

## 2023-05-25 NOTE — Telephone Encounter (Signed)
She can take simethicone up to 125mg  4 times a day

## 2023-05-25 NOTE — Telephone Encounter (Signed)
Spoke with patient and gave recommendations for the gas she is experiencing. She verbalized understanding.

## 2023-05-27 DIAGNOSIS — R001 Bradycardia, unspecified: Secondary | ICD-10-CM

## 2023-05-29 ENCOUNTER — Telehealth: Payer: Self-pay | Admitting: Physician Assistant

## 2023-05-29 NOTE — Telephone Encounter (Signed)
Patient also mentioned she has not yet received her Zio heart monitor that was mailed to her home on 05/21/23.  Confirmed mailing address with patient: [id number]  [place] [address]  Patient denies any issues with receiving mail or packages at this address.   Will forward to our monitor techs to see what we need to do about getting patient her heart monitor.

## 2023-05-29 NOTE — Telephone Encounter (Signed)
   Pt c/o of Chest Pain: STAT if active CP, including tightness, pressure, jaw pain, radiating pain to shoulder/upper arm/back, CP unrelieved by Nitro. Symptoms reported of SOB, nausea, vomiting, sweating.  1. Are you having CP right now? Discomfort in chest- feels like gas-a little st this    2. Are you experiencing any other symptoms (ex. SOB, nausea, vomiting, sweating)? no   3. Is your CP continuous or coming and going?  Comes and goes Started on 05-20-23   4. Have you taken Nitroglycerin?  no   5. How long have you been experiencing CP? Started on 05-20-23    6. If NO CP at time of call then end call with telling Pt to call back or call 911 if Chest pain returns prior to return call from triage team.

## 2023-05-29 NOTE — Telephone Encounter (Signed)
Spoke with patient. She states she has had intermittent chest discomfort since 05/20/23. She states it is gas and she is unsure what to do about it.  She states she is taking Gas X, which helps some. She has lingering gas pain that goes up into her shoulders at night.  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 She is worried about her heart since her ED visit on 05/20/23. She states she feels panicked now and worries over everything. She has changed her diet from sweet/sugary foods and drinks to chicken, vegetables and lime/ginger water. She states she eats most of her vegetables raw in salads.  Encouraged patient to continue taking Gas X PRN, try eating more cooked vegetables to help break down the fiber and make easier to digest, drink water and remain active to help move gas. Encouraged patient to take moments to calm herself, to take deep breaths and get her mind off of her stress when she is able.  Discussed ED precautions, patient verbalized understanding.  Patient has an ED F/U appt with Jari Favre, PA-C on 06/15/23. Patient states she will keep this appt and expressed appreciation for call.

## 2023-05-31 NOTE — Telephone Encounter (Signed)
Pt aware of monitor teams info/advisement.  Pt is going to speak to [place] office and let us know if still cannot locate monitor.

## 2023-06-01 ENCOUNTER — Telehealth: Payer: Self-pay | Admitting: Physician Assistant

## 2023-06-01 DIAGNOSIS — R001 Bradycardia, unspecified: Secondary | ICD-10-CM | POA: Diagnosis not present

## 2023-06-01 NOTE — Telephone Encounter (Signed)
Pt calling back to let us know that she still can not find her monitor. Please advise

## 2023-06-01 NOTE — Telephone Encounter (Signed)
Applied Zio monitor [id number] to patient in the office

## 2023-06-01 NOTE — Telephone Encounter (Signed)
Patient unable to locate Zio heart monitor that had been ordered for her. She states she can come by our office around 4:30pm to pick a heart monitor up.

## 2023-06-13 DIAGNOSIS — R001 Bradycardia, unspecified: Secondary | ICD-10-CM | POA: Diagnosis not present

## 2023-06-15 ENCOUNTER — Encounter: Payer: Self-pay | Admitting: Physician Assistant

## 2023-06-15 ENCOUNTER — Ambulatory Visit: Payer: 59 | Attending: Physician Assistant | Admitting: Physician Assistant

## 2023-06-15 VITALS — BP 138/78 | HR 70 | Ht 61.0 in | Wt 186.4 lb

## 2023-06-15 DIAGNOSIS — R001 Bradycardia, unspecified: Secondary | ICD-10-CM | POA: Diagnosis not present

## 2023-06-15 NOTE — Patient Instructions (Signed)
Medication Instructions:  Your physician recommends that you continue on your current medications as directed. Please refer to the Current Medication list given to you today.  *If you need a refill on your cardiac medications before your next appointment, please call your pharmacy*   Lab Work: None ordered   If you have labs (blood work) drawn today and your tests are completely normal, you will receive your results only by: MyChart Message (if you have MyChart) OR A paper copy in the mail If you have any lab test that is abnormal or we need to change your treatment, we will call you to review the results.   Testing/Procedures: Your physician has referred you to see our Electrophysiologist for bradycardia    Follow-Up: At [facility], you and your health needs are our priority.  As part of our continuing mission to provide you with exceptional heart care, we have created designated Provider Care Teams.  These Care Teams include your primary Cardiologist (physician) and Advanced Practice Providers (APPs -  Physician Assistants and Nurse Practitioners) who all work together to provide you with the care you need, when you need it.  We recommend signing up for the patient portal called "MyChart".  Sign up information is provided on this After Visit Summary.  MyChart is used to connect with patients for Virtual Visits (Telemedicine).  Patients are able to view lab/test results, encounter notes, upcoming appointments, etc.  Non-urgent messages can be sent to your provider as well.   To learn more about what you can do with MyChart, go to [email or web address].    Your next appointment:   3 month(s)  Provider:   Dr. Tessa Lerner    Other Instructions

## 2023-06-15 NOTE — Progress Notes (Signed)
  Cardiology Office Note:  .   Date:  06/15/2023  ID:  [name], DOB 1965-08-13, MRN [id number] PCP: Oneita Hurt, No  [facility] Providers Cardiologist:  None {  History of Present Illness: .   Deanna Jones is a 57 y.o. female without a significant cardiac history presented to the ER 05/20/2023 for the evaluation of hives he was noted to have significant bradycardia and heart block.  7-day monitor was applied at this time nonlive and Dr. Odis Hollingshead agreed to review when results available.  Today, she presents with a history of bradycardia, presents for a follow-up after a recent ER visit and a seven-day heart monitor. The patient reports experiencing heart flutters, particularly during moments of stress or fear. These episodes are described as a fluttering sensation in the chest, lasting only a few seconds. The patient denies any associated symptoms such as lightheadedness, dizziness, feeling like she's going to pass out, or extreme fatigue. The patient also reports having a lot of gas, which she attributes to eating a lot of raw vegetables. The patient has been trying to manage this by eating more steamed vegetables and using gas ads. The patient denies any indigestion or burning sensation in the esophagus after eating.  Reports no shortness of breath nor dyspnea on exertion. Reports no chest pain, pressure, or tightness. No edema, orthopnea, PND. Reports no palpitations.   Discussed the use of AI scribe software for clinical note transcription with the patient, who gave verbal consent to proceed.  ROS: Pertinent ROS in HPI  Studies Reviewed: .        none  Physical Exam:   VS:  BP 138/78 (BP Location: Left Arm, Patient Position: Sitting, Cuff Size: Normal)   Pulse 70   Ht 5\' 1"  (1.549 m)   Wt 186 lb 6.4 oz (84.6 kg)   LMP  (LMP Unknown)   SpO2 98%   BMI 35.22 kg/m    Wt Readings from Last 3 Encounters:  06/15/23 186 lb 6.4 oz (84.6 kg)  05/20/23 193 lb (87.5 kg)   04/09/22 150 lb (68 kg)    GEN: Well nourished, well developed in no acute distress NECK: No JVD; No carotid bruits CARDIAC: RRR, no murmurs, rubs, gallops RESPIRATORY:  Clear to auscultation without rales, wheezing or rhonchi  ABDOMEN: Soft, non-tender, non-distended EXTREMITIES:  No edema; No deformity   ASSESSMENT AND PLAN: .    Bradycardia 7-day heart monitor showed a minimum heart rate of 29 beats per minute, maximum of 104, and average of 53. No dangerous arrhythmias, extra beats, or heart block identified. Patient reports occasional chest fluttering lasting a few seconds, potentially stress-related. No symptoms of lightheadedness, dizziness, syncope, or extreme fatigue reported. -Refer to electrophysiology for further evaluation and management. -Consider echocardiogram on follow-up with Dr. Odis Hollingshead in 2-3 months.  Gastrointestinal discomfort Reports gas and pressure sensation, particularly after consuming raw vegetables. No symptoms of indigestion or esophageal burning reported. -Continue use of gas relief medications as needed.  Psychosocial Patient expresses anxiety related to heart condition and fear of dying in sleep. Ongoing personal stress related to complicated relationship status. -Encourage open communication with healthcare providers about fears and concerns. -Consider referral to mental health services for management of anxiety if it continues to be a significant issue.   Dispo: She can follow-up with the EP at the next available consultation and Dr. Odis Hollingshead in 2 to 3 months  Signed, Sharlene Dory, PA-C

## 2023-06-19 ENCOUNTER — Other Ambulatory Visit: Payer: Self-pay

## 2023-06-19 ENCOUNTER — Telehealth: Payer: Self-pay

## 2023-06-19 DIAGNOSIS — R001 Bradycardia, unspecified: Secondary | ICD-10-CM

## 2023-06-19 NOTE — Telephone Encounter (Signed)
Left message for patient. Also sent MyChart message with details.  From Cc'd chart message: Can we order an echo and GXT for bradycardia?   Thanks!  Sharlene Dory, PA-C   Orders placed.

## 2023-07-11 ENCOUNTER — Ambulatory Visit
Admission: EM | Admit: 2023-07-11 | Discharge: 2023-07-11 | Disposition: A | Discharge status: Home / Self Care | Payer: 59 | Attending: Physician Assistant | Admitting: Physician Assistant

## 2023-07-11 DIAGNOSIS — N760 Acute vaginitis: Secondary | ICD-10-CM | POA: Diagnosis not present

## 2023-07-11 DIAGNOSIS — M545 Low back pain, unspecified: Secondary | ICD-10-CM | POA: Insufficient documentation

## 2023-07-11 LAB — POCT URINALYSIS DIP (MANUAL ENTRY)
Bilirubin, UA: NEGATIVE
Blood, UA: NEGATIVE
Glucose, UA: NEGATIVE mg/dL
Ketones, POC UA: NEGATIVE mg/dL
Nitrite, UA: NEGATIVE
Protein Ur, POC: NEGATIVE mg/dL
Spec Grav, UA: 1.02 (ref 1.010–1.025)
Urobilinogen, UA: 1 U/dL
pH, UA: 7.5 (ref 5.0–8.0)

## 2023-07-11 MED ORDER — FLUCONAZOLE 150 MG PO TABS: ORAL_TABLET | ORAL | 0 refills | Status: DC

## 2023-07-11 NOTE — ED Triage Notes (Signed)
 Patient presents with vaginal discharge and burning. Low back pain. Want STI testing. Treated with monistat and vaginal itching cream.

## 2023-07-11 NOTE — ED Provider Notes (Signed)
 [facility]    CSN: [id number] Arrival date & time: 07/11/23  1431      History   Chief Complaint No chief complaint on file.   HPI Deanna Jones is a 58 y.o. female.   Patient here today for evaluation of vaginal discharge and burning.  She reports that she has had some low back pain as well.  She would like STD screening.  She denies any known exposures.  She has been trying to use Monistat vaginal itch cream without resolution.  The history is provided by the patient.    History reviewed. No pertinent past medical history.  Patient Active Problem List   Diagnosis Date Noted   Sinus bradycardia 05/27/2023    Past Surgical History:  Procedure Laterality Date   ANKLE SURGERY  2013    OB History   No obstetric history on file.      Home Medications    Prior to Admission medications   Medication Sig Start Date End Date Taking? Authorizing Provider  fluconazole  (DIFLUCAN ) 150 MG tablet Take one tab PO today and repeat dose in 3 days if symptoms persist 07/11/23  Yes Billy Asberry FALCON, PA-C  Cocoa Butter CREA by Does not apply route.    [provider]  diphenhydrAMINE (BENADRYL) 25 mg capsule Take 25 mg by mouth every 6 (six) hours as needed for itching, allergies or sleep. Patient not taking: Reported on 06/15/2023    [provider]  ondansetron  (ZOFRAN ) 4 MG tablet Take 1 tablet (4 mg total) by mouth every 8 (eight) hours as needed for vomiting or nausea. Patient not taking: Reported on 06/15/2023 04/09/22   Goodman, Graydon, MD  Pramoxine-Calamine (AVEENO ANTI-ITCH EX) Apply topically. Patient not taking: Reported on 06/15/2023    [provider]  triamcinolone  cream (KENALOG ) 0.1 % Apply 1 Application topically 2 (two) times daily. 05/20/23   LampteyAleene KIDD, MD    Family History History reviewed. No pertinent family history.  Social History Social History   Tobacco Use   Smoking status: Former    Current  packs/day: 0.00    Average packs/day: 1 pack/day for 25.0 years (25.0 ttl pk-yrs)    Types: Cigarettes    Start date: 34    Quit date: 2016    Years since quitting: 9.0    Passive exposure: Current   Smokeless tobacco: Never  Vaping Use   Vaping status: Never Used  Substance Use Topics   Alcohol use: Not Currently   Drug use: Never     Allergies   Patient has no known allergies.   Review of Systems Review of Systems  Constitutional:  Negative for chills and fever.  Eyes:  Negative for discharge and redness.  Respiratory:  Negative for shortness of breath.   Gastrointestinal:  Negative for abdominal pain, nausea and vomiting.  Genitourinary:  Positive for vaginal discharge.     Physical Exam Triage Vital Signs ED Triage Vitals  Encounter Vitals Group     BP 07/11/23 1657 134/85     Systolic BP Percentile --      Diastolic BP Percentile --      Pulse Rate 07/11/23 1657 66     Resp 07/11/23 1657 18     Temp 07/11/23 1657 98.1 F (36.7 C)     Temp src --      SpO2 07/11/23 1657 95 %     Weight 07/11/23 1655 187 lb (84.8 kg)     Height 07/11/23  1655 5' 1 (1.549 m)     Head Circumference --      Peak Flow --      Pain Score 07/11/23 1655 3     Pain Loc --      Pain Education --      Exclude from Growth Chart --    No data found.  Updated Vital Signs BP 134/85 (BP Location: Left Arm)   Pulse 66   Temp 98.1 F (36.7 C)   Resp 18   Ht 5' 1 (1.549 m)   Wt 187 lb (84.8 kg)   SpO2 95%   BMI 35.33 kg/m   Visual Acuity Right Eye Distance:   Left Eye Distance:   Bilateral Distance:    Right Eye Near:   Left Eye Near:    Bilateral Near:     Physical Exam Vitals and nursing note reviewed.  Constitutional:      General: She is not in acute distress.    Appearance: Normal appearance. She is not ill-appearing.  HENT:     Head: Normocephalic and atraumatic.  Eyes:     Conjunctiva/sclera: Conjunctivae normal.  Cardiovascular:     Rate and Rhythm:  Normal rate.  Pulmonary:     Effort: Pulmonary effort is normal. No respiratory distress.  Neurological:     Mental Status: She is alert.  Psychiatric:        Mood and Affect: Mood normal.        Behavior: Behavior normal.        Thought Content: Thought content normal.      UC Treatments / Results  Labs (all labs ordered are listed, but only abnormal results are displayed) Labs Reviewed  URINE CULTURE - Abnormal; Notable for the following components:      Result Value   Culture   (*)    Value: 10,000 COLONIES/mL GROUP B STREP(S.AGALACTIAE)ISOLATED TESTING AGAINST S. AGALACTIAE NOT ROUTINELY PERFORMED DUE TO PREDICTABILITY OF AMP/PEN/VAN SUSCEPTIBILITY. Performed at [facility], [address] [place], [place] [address]    All other components within normal limits  POCT URINALYSIS DIP (MANUAL ENTRY) - Abnormal; Notable for the following components:   Clarity, UA hazy (*)    Leukocytes, UA Trace (*)    All other components within normal limits  CERVICOVAGINAL ANCILLARY ONLY    EKG   Radiology EXERCISE TOLERANCE TEST (ETT) Result Date: 07/12/2023   No ST deviation was noted.   Normal ETT patient only acheived 89% of PMHR   HTN response to exercise   No signficant arrhythmias with stress    Procedures Procedures (including critical care time)  Medications Ordered in UC Medications - No data to display  Initial Impression / Assessment and Plan / UC Course  I have reviewed the triage vital signs and the nursing notes.  Pertinent labs & imaging results that were available during my care of the patient were reviewed by me and considered in my medical decision making (see chart for details).    Diflucan  prescribed for suspected sinus infection and will order screening for STDs as well as BV and yeast.  UA without signs of UTI but will order culture to definitively rule out.  Final Clinical Impressions(s) / UC Diagnoses   Final diagnoses:  Acute vaginitis    Discharge Instructions   None    ED Prescriptions     Medication Sig Dispense Auth. Provider   fluconazole  (DIFLUCAN ) 150 MG tablet Take one tab PO today and repeat dose  in 3 days if symptoms persist 2 tablet Billy Asberry FALCON, PA-C      PDMP not reviewed this encounter.   Billy Asberry FALCON, PA-C 07/14/23 1326

## 2023-07-12 ENCOUNTER — Ambulatory Visit: Payer: 59 | Attending: Physician Assistant

## 2023-07-12 DIAGNOSIS — R001 Bradycardia, unspecified: Secondary | ICD-10-CM

## 2023-07-12 LAB — EXERCISE TOLERANCE TEST
Angina Index: 0
Duke Treadmill Score: 9
Estimated workload: 10.1
Exercise duration (min): 9 min
Exercise duration (sec): 0 s
MPHR: 164 {beats}/min
Peak HR: 142 {beats}/min
Percent HR: 86 %
RPE: 15
Rest HR: 39 {beats}/min
ST Depression (mm): 0 mm

## 2023-07-12 LAB — URINE CULTURE: Culture: 10000 — AB

## 2023-07-13 ENCOUNTER — Telehealth: Payer: Self-pay | Admitting: Cardiology

## 2023-07-13 NOTE — Telephone Encounter (Signed)
 Patient returned RN's call regarding results.

## 2023-07-13 NOTE — Telephone Encounter (Signed)
 Spoke with patient and she is aware of stress test results. Verbalized understanding.

## 2023-07-17 LAB — CERVICOVAGINAL ANCILLARY ONLY
Candida Glabrata: NEGATIVE
Candida Vaginitis: POSITIVE — AB
Chlamydia: NEGATIVE
Comment: NEGATIVE
Comment: NEGATIVE
Comment: NEGATIVE
Comment: NEGATIVE
Comment: NEGATIVE
Comment: NORMAL
Neisseria Gonorrhea: NEGATIVE
Trichomonas: POSITIVE — AB

## 2023-07-18 ENCOUNTER — Ambulatory Visit (HOSPITAL_COMMUNITY): Payer: BLUE CROSS/BLUE SHIELD | Attending: Cardiology

## 2023-07-18 ENCOUNTER — Telehealth (HOSPITAL_COMMUNITY): Payer: Self-pay | Admitting: Emergency Medicine

## 2023-07-18 DIAGNOSIS — I340 Nonrheumatic mitral (valve) insufficiency: Secondary | ICD-10-CM

## 2023-07-18 DIAGNOSIS — R001 Bradycardia, unspecified: Secondary | ICD-10-CM

## 2023-07-18 LAB — ECHOCARDIOGRAM COMPLETE
Area-P 1/2: 2.77 cm2
S' Lateral: 3.1 cm

## 2023-07-18 MED ORDER — METRONIDAZOLE 500 MG PO TABS: 500.0000 mg | ORAL_TABLET | Freq: Two times a day (BID) | ORAL | 0 refills | Status: DC

## 2023-07-18 NOTE — Telephone Encounter (Signed)
 Metronidazole for positive Trichomonas, per protocol

## 2023-07-19 ENCOUNTER — Encounter (HOSPITAL_COMMUNITY): Payer: Self-pay | Admitting: Emergency Medicine

## 2023-07-31 ENCOUNTER — Ambulatory Visit: Payer: BLUE CROSS/BLUE SHIELD | Attending: Cardiology | Admitting: Cardiology

## 2023-07-31 ENCOUNTER — Encounter: Payer: Self-pay | Admitting: Cardiology

## 2023-07-31 VITALS — BP 116/70 | HR 38 | Ht 61.0 in | Wt 188.2 lb

## 2023-07-31 DIAGNOSIS — R001 Bradycardia, unspecified: Secondary | ICD-10-CM

## 2023-07-31 NOTE — Progress Notes (Signed)
  Electrophysiology Office Note:   Date:  07/31/2023  ID:  Brichelle Darrin, DOB April 02, 1966, MRN [id number]  Primary Cardiologist: None Primary Heart Failure: None Electrophysiologist: None      History of Present Illness:   Deanna Jones is a 58 y.o. female with h/o palpitations seen today for  for Electrophysiology evaluation of bradycardia at the request of Jari Favre.    She presented to the emergency room 05/20/2023 with highs.  She was noted to have significant bradycardia and heart block.  She wore a 7-day monitor and thus presents for follow-up.  She has been experiencing intermittent palpitations, particularly when having stress.  These episodes last only a few seconds.  She has no lightheadedness or dizziness.  She has no near-syncope.  Review of systems complete and found to be negative unless listed in HPI.   EP Information / Studies Reviewed:    EKG is ordered today. Personal review as below.  EKG Interpretation Date/Time:  Tuesday July 31 2023 11:32:50 EST Ventricular Rate:  41 PR Interval:  158 QRS Duration:  92 QT Interval:  454 QTC Calculation: 374 R Axis:   23  Text Interpretation: Marked sinus bradycardia with Premature atrial complexes in a pattern of bigeminy When compared with ECG of 31-Jul-2023 11:30, No significant change was found Confirmed by [employer], Larisa Lanius ([id number]) on 07/31/2023 11:39:12 AM     Risk Assessment/Calculations:              Physical Exam:   VS:  BP 116/70 (BP Location: Left Arm, Patient Position: Sitting, Cuff Size: Large)   Pulse (!) 38   Ht 5\' 1"  (1.549 m)   Wt 188 lb 3.2 oz (85.4 kg)   SpO2 95%   BMI 35.56 kg/m    Wt Readings from Last 3 Encounters:  07/31/23 188 lb 3.2 oz (85.4 kg)  07/11/23 187 lb (84.8 kg)  06/15/23 186 lb 6.4 oz (84.6 kg)     GEN: Well nourished, well developed in no acute distress NECK: No JVD; No carotid bruits CARDIAC: Regular rate and rhythm, no murmurs, rubs, gallops RESPIRATORY:  Clear  to auscultation without rales, wheezing or rhonchi  ABDOMEN: Soft, non-tender, non-distended EXTREMITIES:  No edema; No deformity   ASSESSMENT AND PLAN:    1.  Sinus bradycardia: Has had episodes of sinus bradycardia both on EKG and cardiac monitor.  She did have an exercise treadmill test that was without major abnormality.  She is completely asymptomatic.  She does have good AV conduction.  For now, we Kinzee Happel continue with current management.  I Everhett Bozard see her back as needed.  Follow up with Dr. Elberta Fortis  as needed   Signed, Hershal Eriksson Jorja Loa, MD

## 2023-07-31 NOTE — Patient Instructions (Signed)
Medication Instructions:   Your physician recommends that you continue on your current medications as directed. Please refer to the Current Medication list given to you today.  *If you need a refill on your cardiac medications before your next appointment, please call your pharmacy*   Lab Work: None ordered.  If you have labs (blood work) drawn today and your tests are completely normal, you will receive your results only by: MyChart Message (if you have MyChart) OR A paper copy in the mail If you have any lab test that is abnormal or we need to change your treatment, we will call you to review the results.   Testing/Procedures: None ordered.    Follow-Up: At [facility], you and your health needs are our priority.  As part of our continuing mission to provide you with exceptional heart care, we have created designated Provider Care Teams.  These Care Teams include your primary Cardiologist (physician) and Advanced Practice Providers (APPs -  Physician Assistants and Nurse Practitioners) who all work together to provide you with the care you need, when you need it.  We recommend signing up for the patient portal called "MyChart".  Sign up information is provided on this After Visit Summary.  MyChart is used to connect with patients for Virtual Visits (Telemedicine).  Patients are able to view lab/test results, encounter notes, upcoming appointments, etc.  Non-urgent messages can be sent to your provider as well.   To learn more about what you can do with MyChart, go to [email or web address].    Your next appointment:   Follow up with Dr Elberta Fortis as needed.

## 2023-08-30 ENCOUNTER — Emergency Department (HOSPITAL_BASED_OUTPATIENT_CLINIC_OR_DEPARTMENT_OTHER)
Admission: EM | Admit: 2023-08-30 | Discharge: 2023-08-30 | Disposition: A | Discharge status: Home / Self Care | Payer: BLUE CROSS/BLUE SHIELD | Attending: Emergency Medicine | Admitting: Emergency Medicine

## 2023-08-30 ENCOUNTER — Encounter (HOSPITAL_BASED_OUTPATIENT_CLINIC_OR_DEPARTMENT_OTHER): Payer: Self-pay | Admitting: Emergency Medicine

## 2023-08-30 ENCOUNTER — Other Ambulatory Visit: Payer: Self-pay

## 2023-08-30 DIAGNOSIS — R350 Frequency of micturition: Secondary | ICD-10-CM | POA: Insufficient documentation

## 2023-08-30 DIAGNOSIS — R109 Unspecified abdominal pain: Secondary | ICD-10-CM | POA: Diagnosis present

## 2023-08-30 DIAGNOSIS — N898 Other specified noninflammatory disorders of vagina: Secondary | ICD-10-CM | POA: Insufficient documentation

## 2023-08-30 LAB — CBC
HCT: 41.7 % (ref 36.0–46.0)
Hemoglobin: 13.7 g/dL (ref 12.0–15.0)
MCH: 32.1 pg (ref 26.0–34.0)
MCHC: 32.9 g/dL (ref 30.0–36.0)
MCV: 97.7 fL (ref 80.0–100.0)
Platelets: 395 10*3/uL (ref 150–400)
RBC: 4.27 MIL/uL (ref 3.87–5.11)
RDW: 12.6 % (ref 11.5–15.5)
WBC: 9.8 10*3/uL (ref 4.0–10.5)
nRBC: 0 % (ref 0.0–0.2)

## 2023-08-30 LAB — WET PREP, GENITAL
Sperm: NONE SEEN
Trich, Wet Prep: NONE SEEN
WBC, Wet Prep HPF POC: <10 (ref <10)
Yeast Wet Prep HPF POC: NONE SEEN

## 2023-08-30 LAB — URINALYSIS, ROUTINE W REFLEX MICROSCOPIC
Bacteria, UA: NONE SEEN
Bilirubin Urine: NEGATIVE
Glucose, UA: NEGATIVE mg/dL
Hgb urine dipstick: NEGATIVE
Ketones, ur: NEGATIVE mg/dL
Leukocytes,Ua: NEGATIVE
Nitrite: NEGATIVE
Protein, ur: NEGATIVE mg/dL
Specific Gravity, Urine: 1.017 (ref 1.005–1.030)
pH: 7 (ref 5.0–8.0)

## 2023-08-30 LAB — CBG MONITORING, ED: Glucose-Capillary: 133 mg/dL — ABNORMAL HIGH (ref 70–99)

## 2023-08-30 MED ORDER — FLUCONAZOLE 150 MG PO TABS
150.0000 mg | ORAL_TABLET | Freq: Once | ORAL | Status: AC
  Administered 2023-08-30: 150 mg via ORAL
  Filled 2023-08-30: qty 1

## 2023-08-30 MED ORDER — FLUCONAZOLE 200 MG PO TABS: 200.0000 mg | ORAL_TABLET | Freq: Every day | ORAL | 0 refills | Status: AC

## 2023-08-30 MED ORDER — METRONIDAZOLE 500 MG PO TABS
2000.0000 mg | ORAL_TABLET | Freq: Once | ORAL | Status: AC
  Administered 2023-08-30: 2000 mg via ORAL
  Filled 2023-08-30: qty 4

## 2023-08-30 NOTE — ED Triage Notes (Signed)
C/o LLQ pain that rads into back since last night. Endorses increased urinary frequency. HR in 40s during triage, but states that is her normal and has been followed by cardiologist.

## 2023-08-30 NOTE — ED Provider Notes (Signed)
[facility] EMERGENCY DEPARTMENT AT [facility] Provider Note   CSN: [id number] Arrival date & time: 08/30/23  1705     History  Chief Complaint  Patient presents with   Abdominal Pain    Deanna Jones is a 58 y.o. female  female who presents with left flank pain and frequent urination that started last night.  Patient states she has never had the symptoms before denies any injury or trauma.  Denies any dysuria.  She does not have a history of UTIs.  Does not have a history of diabetes.  Denies any abdominal pain, vomiting or diarrhea.  No reported fevers or chills.  No history of kidney stones.   Abdominal Pain    History reviewed. No pertinent past medical history.   Home Medications Prior to Admission medications   Medication Sig Start Date End Date Taking? Authorizing Provider  fluconazole (DIFLUCAN) 200 MG tablet Take 1 tablet (200 mg total) by mouth daily for 1 day. 08/30/23 08/31/23 Yes Halford Decamp, PA-C  Cocoa Butter CREA by Does not apply route. Patient not taking: Reported on 07/31/2023    [provider]  diphenhydrAMINE (BENADRYL) 25 mg capsule Take 25 mg by mouth every 6 (six) hours as needed for itching, allergies or sleep. Patient not taking: Reported on 07/31/2023    [provider]  fluconazole (DIFLUCAN) 150 MG tablet Take one tab PO today and repeat dose in 3 days if symptoms persist Patient not taking: Reported on 07/31/2023 07/11/23   Tomi Bamberger, PA-C  metroNIDAZOLE (FLAGYL) 500 MG tablet Take 1 tablet (500 mg total) by mouth 2 (two) times daily. Patient not taking: Reported on 07/31/2023 07/18/23   Merrilee Jansky, MD  ondansetron (ZOFRAN) 4 MG tablet Take 1 tablet (4 mg total) by mouth every 8 (eight) hours as needed for vomiting or nausea. Patient not taking: Reported on 07/31/2023 04/09/22   Phineas Semen, MD  Pramoxine-Calamine (AVEENO ANTI-ITCH EX) Apply topically. Patient not taking: Reported on 07/31/2023     [provider]  triamcinolone cream (KENALOG) 0.1 % Apply 1 Application topically 2 (two) times daily. Patient not taking: Reported on 07/31/2023 05/20/23   Merrilee Jansky, MD      Allergies    Patient has no known allergies.    Review of Systems   Review of Systems  Gastrointestinal:  Positive for abdominal pain.    Physical Exam Updated Vital Signs BP (!) 145/75 (BP Location: Right Arm)   Pulse 65   Temp 98 F (36.7 C) (Oral)   Resp 18   Ht 5\' 1"  (1.549 m)   Wt 82.6 kg   SpO2 98%   BMI 34.39 kg/m  Physical Exam Vitals and nursing note reviewed.  Constitutional:      General: She is not in acute distress.    Appearance: She is well-developed.  HENT:     Head: Normocephalic and atraumatic.  Eyes:     Conjunctiva/sclera: Conjunctivae normal.  Cardiovascular:     Rate and Rhythm: Normal rate and regular rhythm.     Heart sounds: No murmur heard. Pulmonary:     Effort: Pulmonary effort is normal. No respiratory distress.     Breath sounds: Normal breath sounds.  Abdominal:     Palpations: Abdomen is soft.     Tenderness: There is no abdominal tenderness. There is no right CVA tenderness or left CVA tenderness.  Musculoskeletal:        General: No swelling.  Cervical back: Neck supple.     Comments: Mild left lumbar paraspinal tenderness without overlying erythema, warmth, fluctuance or induration.  No midline spinal tenderness  Skin:    General: Skin is warm and dry.     Capillary Refill: Capillary refill takes less than 2 seconds.  Neurological:     Mental Status: She is alert.  Psychiatric:        Mood and Affect: Mood normal.     ED Results / Procedures / Treatments   Labs (all labs ordered are listed, but only abnormal results are displayed) Labs Reviewed  CBG MONITORING, ED - Abnormal; Notable for the following components:      Result Value   Glucose-Capillary 133 (*)    All other components within normal limits  WET PREP, GENITAL   CBC  URINALYSIS, ROUTINE W REFLEX MICROSCOPIC  COMPREHENSIVE METABOLIC PANEL  LIPASE, BLOOD  GC/CHLAMYDIA PROBE AMP ([place]) NOT AT [facility]    EKG None  Radiology No results found.  Procedures Procedures    Medications Ordered in ED Medications  fluconazole (DIFLUCAN) tablet 150 mg (150 mg Oral Given 08/30/23 2127)  metroNIDAZOLE (FLAGYL) tablet 2,000 mg (2,000 mg Oral Given 08/30/23 2127)    ED Course/ Medical Decision Making/ A&P                                 Medical Decision Making Amount and/or Complexity of Data Reviewed Labs: ordered.   This patient presents to the ED with chief complaint(s) of increased urinary frequency and flank pain.  The complaint involves an extensive differential diagnosis and also carries with it a high risk of complications and morbidity.   pertinent past medical history as listed in HPI  The differential diagnosis includes   UTI, pyelonephritis, nephrolithiasis, musculoskeletal, diabetes  The initial plan is to  Will start with basic labs and urine Additional history obtained: Additional history obtained from spouse Records reviewed Care Everywhere/External Records  Initial Assessment:   Patient presents hypertensive to 163/89 and bradycardic to 48 with complaints of increased urinary frequency and left-sided flank pain that started last night.  No dysuria to suggest UTI or pyelonephritis.  No CVAT or hematuria to suggest nephrolithiasis.  She has no history of either.  She has no radicular symptoms to suggest neurological etiology.    Independent ECG interpretation:  none  Independent labs interpretation:  The following labs were independently interpreted:  CBC, UA unremarkable  Independent visualization and interpretation of imaging: none  Treatment and Reassessment: None  Received message from lab that green vile for CMP was lost.  Discussed this with patient.  She would prefer to go home.  At this time informed her  that there is no evidence of UTI, pyelonephritis or nephrolithiasis.  The primary remaining concern is for hyperglycemia.  She would prefer not to wait on any further lab work.  We will obtain simple CBG.  CBG was 133.  Discussed these findings with patient.  Urinary frequency not likely secondary to hyperglycemia.  Patient denied vaginal discharge earlier.  However was not reporting she is having persistent white vaginal discharge.  She was seen last month for similar complaints.  Tested positive for trichomoniasis and candidiasis.  Was treated with Diflucan and Flagyl.  Reports that she did have intercourse on the seventh day of treatment with Flagyl.  Symptoms improved temporarily but improved shortly thereafter.  Patient is interested in treatment again.  Offered additionally empiric treatment for GC.  She declines at this time.  Will treat again with Diflucan and Flagyl.  Additional dose of Diflucan sent into pharmacy should symptoms persist.  Otherwise suspect flank pain to be musculoskeletal.  Offered patient muscle relaxants and lidocaine patches.  She would prefer to just use Tylenol.  Consultations obtained:   none  Disposition:   Patient be discharged home.  Encouraged to follow with primary care doctor.  The patient has been appropriately medically screened and/or stabilized in the ED. I have low suspicion for any other emergent medical condition which would require further screening, evaluation or treatment in the ED or require inpatient management. At time of discharge the patient is hemodynamically stable and in no acute distress. I have discussed work-up results and diagnosis with patient and answered all questions. Patient is agreeable with discharge plan. We discussed strict return precautions for returning to the emergency department and they verbalized understanding.     Social Determinants of Health:   none  This note was dictated with voice recognition software.  Despite best  efforts at proofreading, errors may have occurred which can change the documentation meaning.          Final Clinical Impression(s) / ED Diagnoses Final diagnoses:  Flank pain  Urinary frequency  Vaginal discharge    Rx / DC Orders ED Discharge Orders          Ordered    fluconazole (DIFLUCAN) 200 MG tablet  Daily        08/30/23 2114              Fabienne Bruns 08/30/23 2137    Linwood Dibbles, MD 08/31/23 [phone]

## 2023-08-30 NOTE — Discharge Instructions (Addendum)
You were evaluated in the emergency room for frequent urination and flank pain.  Your lab work did not show any significant abnormality.  You were given empiric treatment today for a yeast infection and trichomonas.  The remainder of your lab work is still pending and can be viewed on MyChart within the next few days.  If you have not heard any results please call.  I suspect your flank pain is likely musculoskeletal.  You are welcome to use Tylenol 1000 mg up to 3 times a day every 4-6 hours for pain.  If you experience persistent symptoms please follow-up with your primary care doctor otherwise if anything worsens you are welcome to return for further evaluation.

## 2023-08-30 NOTE — ED Notes (Signed)
Pt was hesitant to change into a gown. Hesitant to sit in stretcher. Questioning IV, labs and possible CT. PA notified and at bedside talking with patient.

## 2023-08-30 NOTE — ED Notes (Signed)
Lab spoke with charge nurse and stated they needed a new swab for GC/Chlamydia. This RN called patient twice with no answer.Unable to leave voicemail due to inbox being full.

## 2023-09-17 ENCOUNTER — Encounter: Payer: Self-pay | Admitting: Cardiology

## 2023-09-17 ENCOUNTER — Ambulatory Visit: Payer: 59 | Attending: Cardiology | Admitting: Cardiology

## 2023-09-17 VITALS — BP 118/68 | HR 65 | Resp 16 | Ht 61.0 in | Wt 189.0 lb

## 2023-09-17 DIAGNOSIS — R001 Bradycardia, unspecified: Secondary | ICD-10-CM

## 2023-09-17 NOTE — Progress Notes (Signed)
 Cardiology Office Note:  .   Date:  09/17/2023  ID:  Deanna Jones, DOB 1965-11-15, MRN [id number] PCP:  Pcp, No  Former Cardiology Providers: NA [facility] Providers Cardiologist:  Tessa Lerner, DO, [facility] (established care 09/17/23) Electrophysiologist:  Will Jorja Loa, MD  Electrophysiologist:  Will Jorja Loa, MD  Click to update primary MD,subspecialty MD or APP then REFRESH:1}    Chief Complaint  Patient presents with   Bradycardia   Follow-up    3 months    History of Present Illness: .   Deanna Jones is a 58 y.o. African-American female whose past medical history and cardiovascular risk factors includes: Bradycardia.  Patient was seen in the ER back in November 2024 for for rash.  She was incidentally noted to be bradycardic on the monitor with a pulse in the high 30s and low 40 bpm.  Patient was recommended to undergo Zio patch to evaluate for dysrhythmias/pauses.   Patient did undergo Zio patch, echocardiogram, and a GXT.  She was seen by Jari Favre PA-C and was referred to EP for further evaluation and management.  Patient was seen by Dr. Elberta Fortis and felt that underlying bradycardia is physiological and no additional workup is warranted.  She presents today to establish care.  Patient denies anginal chest pain or heart failure symptoms.  Overall function capacity remains stable.  No lightheaded or dizziness, no near-syncope or syncopal events.  Review of Systems: .   Review of Systems  Cardiovascular:  Negative for chest pain, claudication, irregular heartbeat, leg swelling, near-syncope, orthopnea, palpitations, paroxysmal nocturnal dyspnea and syncope.  Respiratory:  Negative for shortness of breath.   Hematologic/Lymphatic: Negative for bleeding problem.    Studies Reviewed:   Echocardiogram: 07/2023  1. Left ventricular ejection fraction, by estimation, is 55 to 60%. Left  ventricular ejection fraction by 3D volume is 65 %. The left  ventricle has  normal function. The left ventricle has no regional wall motion  abnormalities. Left ventricular diastolic   parameters were normal. The average left ventricular global longitudinal  strain is -20.9 %. The global longitudinal strain is normal.   2. Right ventricular systolic function is normal. The right ventricular  size is normal. There is normal pulmonary artery systolic pressure.   3. A small pericardial effusion is present. There is no evidence of  cardiac tamponade.   4. The mitral valve is normal in structure. Mild mitral valve  regurgitation. No evidence of mitral stenosis.   5. The aortic valve is tricuspid. Aortic valve regurgitation is not  visualized. No aortic stenosis is present.   6. The inferior vena cava is dilated in size with >50% respiratory  variability, suggesting right atrial pressure of 8 mmHg.   Stress Testing: GXT 07/12/2023   No ST deviation was noted.   Normal ETT patient only acheived 89% of PMHR   HTN response to exercise   No signficant arrhythmias with stress  Cardiac monitor (Zio Patch): 06/01/2023 - 06/10/2023 Dominant rhythm sinus, followed by bradycardia (burden 51%). Heart rate 29-104 bpm.  Avg HR 53 bpm. Minimum heart rate of 29 bpm occurred on 06/07/2023 at 6:13 AM -underlying rhythm sinus bradycardia. No atrial fibrillation detected during the monitoring period. No supraventricular tachycardia, ventricular tachycardia, high grade AV block, pauses (3 seconds or longer). No supraventricular ectopic burden. Total ventricular ectopic burden <1%. Patient triggered events: 0.   RADIOLOGY: NA  Risk Assessment/Calculations:   N/A  Labs:  Latest Ref Rng & Units 08/30/2023    7:14 PM 05/20/2023    3:25 PM 04/09/2022    4:44 PM  CBC  WBC 4.0 - 10.5 K/uL 9.8  7.7  7.9   Hemoglobin 12.0 - 15.0 g/dL 30.8  65.7  84.6   Hematocrit 36.0 - 46.0 % 41.7  46.0  43.7   Platelets 150 - 400 K/uL 395  387  415        Latest Ref  Rng & Units 05/20/2023    3:25 PM 04/09/2022    4:44 PM 05/31/2015    9:41 AM  BMP  Glucose 70 - 99 mg/dL 94  962  952   BUN 6 - 24 mg/dL 12  9  13    Creatinine 0.57 - 1.00 mg/dL 8.41  3.24  4.01   BUN/Creat Ratio 9 - 23 17     Sodium 134 - 144 mmol/L 142  141  140   Potassium 3.5 - 5.2 mmol/L 4.5  3.5  4.8   Chloride 96 - 106 mmol/L 104  111  106   CO2 20 - 29 mmol/L 23  25  27    Calcium 8.7 - 10.2 mg/dL 02.7  9.2  9.2       Latest Ref Rng & Units 05/20/2023    3:25 PM 04/09/2022    4:44 PM 05/31/2015    9:41 AM  CMP  Glucose 70 - 99 mg/dL 94  253  664   BUN 6 - 24 mg/dL 12  9  13    Creatinine 0.57 - 1.00 mg/dL 4.03  4.74  2.59   Sodium 134 - 144 mmol/L 142  141  140   Potassium 3.5 - 5.2 mmol/L 4.5  3.5  4.8   Chloride 96 - 106 mmol/L 104  111  106   CO2 20 - 29 mmol/L 23  25  27    Calcium 8.7 - 10.2 mg/dL 56.3  9.2  9.2   Total Protein 6.0 - 8.5 g/dL 7.3  7.9  8.0   Total Bilirubin 0.0 - 1.2 mg/dL 0.3  0.8  0.9   Alkaline Phos 44 - 121 IU/L 92  75  82   AST 0 - 40 IU/L 20  22  26    ALT 0 - 32 IU/L 27  24  21      No results found for: "CHOL", "HDL", "LDLCALC", "LDLDIRECT", "TRIG", "CHOLHDL" No results for input(s): "LIPOA" in the last 8760 hours. No components found for: "NTPROBNP" No results for input(s): "PROBNP" in the last 8760 hours. Recent Labs    05/20/23 1525  TSH 3.150    Physical Exam:    Today's Vitals   09/17/23 0930  BP: 118/68  Pulse: 65  Resp: 16  SpO2: 98%  Weight: 189 lb (85.7 kg)  Height: 5\' 1"  (1.549 m)   Body mass index is 35.71 kg/m. Wt Readings from Last 3 Encounters:  09/17/23 189 lb (85.7 kg)  08/30/23 182 lb (82.6 kg)  07/31/23 188 lb 3.2 oz (85.4 kg)    Physical Exam  Constitutional: No distress.  hemodynamically stable  Neck: No JVD present.  Cardiovascular: Normal rate, regular rhythm, S1 normal and S2 normal. Exam reveals no gallop, no S3 and no S4.  No murmur heard. Pulmonary/Chest: Effort normal and breath sounds  normal. No stridor. She has no wheezes. She has no rales.  Musculoskeletal:        General: No edema.     Cervical back: Neck supple.  Skin: Skin is warm.     Impression & Recommendation(s):  Impression:   ICD-10-CM   1. Sinus bradycardia  R00.1        Recommendation(s):  Sinus bradycardia Incidentally noted during her urgent care visit back in November 2024. Has undergone echocardiogram, Zio patch, and GXT results reviewed with her in detail and noted above for further reference No near-syncope or syncopal events. Has been evaluated by EP-no additional workup warranted. No identifiable reversible cause. Avoid AV nodal blocking agents if possible.  Recommended follow-up as needed.  However, patient requesting annual follow-up visits.  I have advised her to establish care with PCP.  She later informs me that she sees a provider in [place].  Will defer management of other chronic comorbid conditions to PCP.  Advised the patient to schedule her annual follow-up approximately a month after her yearly annual visit with PCP.  This way the most recent labs are available for review/reference.  Orders Placed:  No orders of the defined types were placed in this encounter.  Final Medication List:   No orders of the defined types were placed in this encounter.   Medications Discontinued During This Encounter  Medication Reason   Cocoa Butter CREA Patient Preference   diphenhydrAMINE (BENADRYL) 25 mg capsule Patient Preference   fluconazole (DIFLUCAN) 150 MG tablet Patient Preference   metroNIDAZOLE (FLAGYL) 500 MG tablet Patient Preference   ondansetron (ZOFRAN) 4 MG tablet Patient Preference   Pramoxine-Calamine (AVEENO ANTI-ITCH EX) Patient Preference   triamcinolone cream (KENALOG) 0.1 % Patient Preference    No current outpatient medications on file.  Consent:   NA  Disposition:   1 year follow-up sooner if needed  Her questions and concerns were addressed to her  satisfaction. She voices understanding of the recommendations provided during this encounter.    Signed, Tessa Lerner, DO, [facility]  [facility]  [address] [place], [place] [address] 09/17/2023 10:16 AM

## 2023-09-17 NOTE — Patient Instructions (Signed)
 Medication Instructions:  Your physician recommends that you continue on your current medications as directed. Please refer to the Current Medication list given to you today.  *If you need a refill on your cardiac medications before your next appointment, please call your pharmacy*  Lab Work: None ordered today. If you have labs (blood work) drawn today and your tests are completely normal, you will receive your results only by: MyChart Message (if you have MyChart) OR A paper copy in the mail If you have any lab test that is abnormal or we need to change your treatment, we will call you to review the results.  Testing/Procedures: None ordered today.  Follow-Up: At [facility], you and your health needs are our priority.  As part of our continuing mission to provide you with exceptional heart care, we have created designated Provider Care Teams.  These Care Teams include your primary Cardiologist (physician) and Advanced Practice Providers (APPs -  Physician Assistants and Nurse Practitioners) who all work together to provide you with the care you need, when you need it.  We recommend signing up for the patient portal called "MyChart".  Sign up information is provided on this After Visit Summary.  MyChart is used to connect with patients for Virtual Visits (Telemedicine).  Patients are able to view lab/test results, encounter notes, upcoming appointments, etc.  Non-urgent messages can be sent to your provider as well.   To learn more about what you can do with MyChart, go to [email or web address].    Your next appointment:   1 year(s)  The format for your next appointment:   In Person  Provider:   Dr. Odis Hollingshead

## 2023-11-19 ENCOUNTER — Ambulatory Visit

## 2023-11-22 ENCOUNTER — Ambulatory Visit
Admission: RE | Admit: 2023-11-22 | Discharge: 2023-11-22 | Disposition: A | Discharge status: Home / Self Care | Source: Ambulatory Visit

## 2023-11-22 VITALS — BP 148/76 | HR 55 | Temp 97.7°F | Resp 16

## 2023-11-22 DIAGNOSIS — N898 Other specified noninflammatory disorders of vagina: Secondary | ICD-10-CM | POA: Insufficient documentation

## 2023-11-22 NOTE — Discharge Instructions (Addendum)
  1. Vaginal discharge (Primary) - Cervicovaginal swab collected in UC and sent to lab for further testing results should be available in 2 to 3 days. - If any test results are positive you will be contacted appropriate guidance provided. -Continue to monitor symptoms for any change in severity if there is any escalation of current symptoms or development of new symptoms follow-up in ER for further evaluation and management.

## 2023-11-22 NOTE — ED Provider Notes (Signed)
  [facility]  Note:  This document was prepared using Conservation officer, historic buildings and may include unintentional dictation errors.  MRN: [id number] DOB: 17-Nov-1965  Subjective:   Deanna Jones is a 58 y.o. female presenting for white vaginal discharge with mild odor.  Patient reports past history of trichomonas which was similar to current symptoms.  Patient denies any dysuria, abdominal pain, flank pain, increased urinary frequency, vaginal lesion.  Patient would like testing to see what is causing her vaginal discharge.  No current facility-administered medications for this encounter.  Current Outpatient Medications:    aspirin 81 MG chewable tablet, Chew by mouth daily., Disp: , Rfl:    No Known Allergies  History reviewed. No pertinent past medical history.   Past Surgical History:  Procedure Laterality Date   ANKLE SURGERY  2013    History reviewed. No pertinent family history.  Social History   Tobacco Use   Smoking status: Former    Current packs/day: 0.00    Average packs/day: 1 pack/day for 25.0 years (25.0 ttl pk-yrs)    Types: Cigarettes    Start date: 61    Quit date: 2016    Years since quitting: 9.3    Passive exposure: Current   Smokeless tobacco: Never  Vaping Use   Vaping status: Never Used  Substance Use Topics   Alcohol use: Not Currently   Drug use: Never    ROS Refer to HPI for ROS details.  Objective:   Vitals: BP (!) 148/76   Pulse (!) 55   Temp 97.7 F (36.5 C) (Oral)   Resp 16   SpO2 96%   Physical Exam Vitals and nursing note reviewed.  Constitutional:      General: She is not in acute distress.    Appearance: She is well-developed. She is not ill-appearing or toxic-appearing.  HENT:     Head: Normocephalic and atraumatic.  Cardiovascular:     Rate and Rhythm: Normal rate.  Pulmonary:     Effort: Pulmonary effort is normal. No respiratory distress.  Abdominal:     General: There is no distension.      Palpations: Abdomen is soft.     Tenderness: There is no abdominal tenderness. There is no right CVA tenderness, left CVA tenderness, guarding or rebound.  Genitourinary:    Vagina: Vaginal discharge present.  Skin:    General: Skin is warm and dry.  Neurological:     General: No focal deficit present.     Mental Status: She is alert and oriented to person, place, and time.  Psychiatric:        Mood and Affect: Mood normal.        Behavior: Behavior normal.     Procedures  No results found for this or any previous visit (from the past 24 hours).  No results found.   Assessment and Plan :     Discharge Instructions       1. Vaginal discharge (Primary) - Cervicovaginal swab collected in UC and sent to lab for further testing results should be available in 2 to 3 days. - If any test results are positive you will be contacted appropriate guidance provided. -Continue to monitor symptoms for any change in severity if there is any escalation of current symptoms or development of new symptoms follow-up in ER for further evaluation and management.     Nur Krasinski B Yaqub Arney   Claudio Mondry, [place], [place] 11/22/23 1908

## 2023-11-22 NOTE — ED Triage Notes (Signed)
 Pt reports sticky white discharge x1 week and endorses vaginal itching. Pt reports positive Trichomonas in Jan '25 and states she felt similar then.   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

## 2023-11-23 ENCOUNTER — Ambulatory Visit (HOSPITAL_COMMUNITY): Payer: Self-pay

## 2023-11-23 LAB — CERVICOVAGINAL ANCILLARY ONLY
Bacterial Vaginitis (gardnerella): POSITIVE — AB
Candida Glabrata: NEGATIVE
Candida Vaginitis: NEGATIVE
Chlamydia: NEGATIVE
Comment: NEGATIVE
Comment: NEGATIVE
Comment: NEGATIVE
Comment: NEGATIVE
Comment: NEGATIVE
Comment: NORMAL
Neisseria Gonorrhea: NEGATIVE
Trichomonas: POSITIVE — AB

## 2023-11-23 MED ORDER — METRONIDAZOLE 500 MG PO TABS: 500.0000 mg | ORAL_TABLET | Freq: Two times a day (BID) | ORAL | 0 refills | Status: AC

## 2023-12-12 ENCOUNTER — Emergency Department (HOSPITAL_BASED_OUTPATIENT_CLINIC_OR_DEPARTMENT_OTHER)

## 2023-12-12 ENCOUNTER — Emergency Department (HOSPITAL_BASED_OUTPATIENT_CLINIC_OR_DEPARTMENT_OTHER)
Admission: EM | Admit: 2023-12-12 | Discharge: 2023-12-12 | Disposition: A | Discharge status: Home / Self Care | Attending: Emergency Medicine | Admitting: Emergency Medicine

## 2023-12-12 ENCOUNTER — Other Ambulatory Visit: Payer: Self-pay

## 2023-12-12 ENCOUNTER — Encounter (HOSPITAL_BASED_OUTPATIENT_CLINIC_OR_DEPARTMENT_OTHER): Payer: Self-pay | Admitting: Emergency Medicine

## 2023-12-12 DIAGNOSIS — R1900 Intra-abdominal and pelvic swelling, mass and lump, unspecified site: Secondary | ICD-10-CM | POA: Diagnosis not present

## 2023-12-12 DIAGNOSIS — Z7982 Long term (current) use of aspirin: Secondary | ICD-10-CM | POA: Insufficient documentation

## 2023-12-12 DIAGNOSIS — R109 Unspecified abdominal pain: Secondary | ICD-10-CM | POA: Diagnosis not present

## 2023-12-12 DIAGNOSIS — R319 Hematuria, unspecified: Secondary | ICD-10-CM | POA: Insufficient documentation

## 2023-12-12 LAB — BASIC METABOLIC PANEL WITH GFR
Anion gap: 14 (ref 5–15)
BUN: 13 mg/dL (ref 6–20)
CO2: 23 mmol/L (ref 22–32)
Calcium: 9.9 mg/dL (ref 8.9–10.3)
Chloride: 103 mmol/L (ref 98–111)
Creatinine, Ser: 0.67 mg/dL (ref 0.44–1.00)
GFR, Estimated: >60 mL/min (ref >60)
Glucose, Bld: 86 mg/dL (ref 70–99)
Potassium: 3.7 mmol/L (ref 3.5–5.1)
Sodium: 139 mmol/L (ref 135–145)

## 2023-12-12 LAB — CBC
HCT: 41.3 % (ref 36.0–46.0)
Hemoglobin: 13.6 g/dL (ref 12.0–15.0)
MCH: 31.4 pg (ref 26.0–34.0)
MCHC: 32.9 g/dL (ref 30.0–36.0)
MCV: 95.4 fL (ref 80.0–100.0)
Platelets: 329 10*3/uL (ref 150–400)
RBC: 4.33 MIL/uL (ref 3.87–5.11)
RDW: 12.3 % (ref 11.5–15.5)
WBC: 7.1 10*3/uL (ref 4.0–10.5)
nRBC: 0 % (ref 0.0–0.2)

## 2023-12-12 LAB — WET PREP, GENITAL
Clue Cells Wet Prep HPF POC: NONE SEEN
Sperm: NONE SEEN
Trich, Wet Prep: NONE SEEN
WBC, Wet Prep HPF POC: <10 (ref <10)
Yeast Wet Prep HPF POC: NONE SEEN

## 2023-12-12 LAB — URINALYSIS, ROUTINE W REFLEX MICROSCOPIC
Bilirubin Urine: NEGATIVE
Glucose, UA: >1000 mg/dL — AB
Ketones, ur: 15 mg/dL — AB
Leukocytes,Ua: NEGATIVE
Nitrite: NEGATIVE
Specific Gravity, Urine: 1.028 (ref 1.005–1.030)
pH: 5.5 (ref 5.0–8.0)

## 2023-12-12 MED ORDER — NAPROXEN 500 MG PO TABS: 500.0000 mg | ORAL_TABLET | Freq: Two times a day (BID) | ORAL | 0 refills | Status: AC

## 2023-12-12 MED ORDER — ACETAMINOPHEN 325 MG PO TABS
650.0000 mg | ORAL_TABLET | Freq: Once | ORAL | Status: AC
  Administered 2023-12-12: 650 mg via ORAL
  Filled 2023-12-12: qty 2

## 2023-12-12 NOTE — Discharge Instructions (Addendum)
 Take Naproxen  as prescribed as needed for pain. Follow up with a gynecologist. Please see the referral provided as well as list below.   [facility] for [facility] at [facility]  [address]  [phone]   Center for [facility] at [facility]  [address] [employer]  [phone]   [facility] for [facility] at [facility] [address] [place]  [phone]   [facility] for [facility] at [facility]  [address]  [phone]   [facility] for [facility]  [address]  [phone]   If you already have an established OB/GYN provider in the area you can make an appointment with them as well.

## 2023-12-12 NOTE — ED Triage Notes (Signed)
 Left flank pain radiating into left groin. Pink tinged urine. Started Monday. Denies CP SOB.

## 2023-12-12 NOTE — ED Provider Notes (Signed)
 Deanna Jones   CSN: [id number] Arrival date & time: 12/12/23  1857     History  Chief Complaint  Patient presents with   Flank Pain    Deanna Jones is a 58 y.o. female.  58 year old female with no significant past medical history presents with complaint of left flank pain and hematuria.  Symptoms started a few days ago, have been intermittent but more bothersome today.  No associated fevers, nausea, vomiting or dysuria.  Also reports that she recently tested positive for trichomoniasis, was treated with Flagyl  however she had intercourse with her husband prior to him being treated and now has questions if she could have reinfected herself.       Home Medications Prior to Admission medications   Medication Sig Start Date End Date Taking? Authorizing Provider  naproxen  (NAPROSYN ) 500 MG tablet Take 1 tablet (500 mg total) by mouth 2 (two) times daily. 12/12/23  Yes Darlis Eisenmenger, PA-C  aspirin 81 MG chewable tablet Chew by mouth daily.    [provider]      Allergies    Patient has no known allergies.    Review of Systems   Review of Systems Negative except as per HPI Physical Exam Updated Vital Signs BP (!) 158/100   Pulse 67   Temp 97.6 F (36.4 C) (Oral)   Resp 16   Ht 5\' 1"  (1.549 m)   Wt 81.6 kg   SpO2 100%   BMI 34.01 kg/m  Physical Exam Vitals and nursing Jones reviewed.  Constitutional:      General: She is not in acute distress.    Appearance: She is well-developed. She is not diaphoretic.  HENT:     Head: Normocephalic and atraumatic.  Cardiovascular:     Rate and Rhythm: Normal rate and regular rhythm.     Heart sounds: Normal heart sounds.  Pulmonary:     Effort: Pulmonary effort is normal.     Breath sounds: Normal breath sounds.  Abdominal:     Palpations: Abdomen is soft.     Tenderness: There is no abdominal tenderness. There is no right CVA tenderness or left CVA  tenderness.  Musculoskeletal:        General: No tenderness.     Thoracic back: No tenderness or bony tenderness.     Lumbar back: No tenderness or bony tenderness.  Skin:    General: Skin is warm and dry.     Findings: No erythema or rash.  Neurological:     Mental Status: She is alert and oriented to person, place, and time.  Psychiatric:        Behavior: Behavior normal.     ED Results / Procedures / Treatments   Labs (all labs ordered are listed, but only abnormal results are displayed) Labs Reviewed  URINALYSIS, ROUTINE W REFLEX MICROSCOPIC - Abnormal; Notable for the following components:      Result Value   Glucose, UA >1,000 (*)    Hgb urine dipstick SMALL (*)    Ketones, ur 15 (*)    Protein, ur TRACE (*)    Bacteria, UA RARE (*)    All other components within normal limits  WET PREP, GENITAL  BASIC METABOLIC PANEL WITH GFR  CBC    EKG None  Radiology CT Renal Stone Study Result Date: 12/12/2023 CLINICAL DATA:  Abdominal and flank pain with stone suspected. Left flank pain radiating to the left groin. Pink tinged  urine starting on Monday. EXAM: CT ABDOMEN AND PELVIS WITHOUT CONTRAST TECHNIQUE: Multidetector CT imaging of the abdomen and pelvis was performed following the standard protocol without IV contrast. RADIATION DOSE REDUCTION: This exam was performed according to the departmental dose-optimization program which includes automated exposure control, adjustment of the mA and/or kV according to patient size and/or use of iterative reconstruction technique. COMPARISON:  None Available. FINDINGS: Lower chest: Slight linear scarring in the lung bases. Hepatobiliary: No focal liver abnormality is seen. Status post cholecystectomy. No biliary dilatation. Pancreas: Unremarkable. No pancreatic ductal dilatation or surrounding inflammatory changes. Spleen: Normal in size without focal abnormality. Adrenals/Urinary Tract: No adrenal gland nodules. Kidneys are symmetrical. No  hydronephrosis or hydroureter. No renal, ureteral, or bladder stones. Bladder is decompressed without significant wall thickening. Stomach/Bowel: Stomach, small bowel, and colon are not abnormally distended. No wall thickening or inflammatory changes. Appendix is normal. Vascular/Lymphatic: No significant vascular findings are present. No enlarged abdominal or pelvic lymph nodes. Reproductive: There is a large solid mass in the pelvis measuring 10.2 x 11.4 cm in diameter. Central heterogeneous fat density is present. Based on the size of the lesion, it is difficult to tell the origin. This could represent a fat density endometrial mass, a large uterine fibroid, or an ovarian mass such as dermoid cyst. Due to size, the appearance may be concerning for malignancy. MRI correlation is recommended. Surgical clips in the pelvis. Other: No free air or free fluid in the abdomen. Abdominal wall musculature appears intact. Musculoskeletal: No acute or significant osseous findings. IMPRESSION: 1. Large solid mass in the pelvis measuring 11.4 cm maximal diameter and containing heterogeneous fat density. This may arise from ovary, endometrium, or uterus. Malignancy is suspected. MRI correlation is recommended. 2. No renal or ureteral stone or obstruction. Electronically Signed   By: Boyce Byes M.D.   On: 12/12/2023 20:58    Procedures Procedures    Medications Ordered in ED Medications  acetaminophen (TYLENOL) tablet 650 mg (has no administration in time range)    ED Course/ Medical Decision Making/ A&P                                 Medical Decision Making Amount and/or Complexity of Data Reviewed Labs: ordered. Radiology: ordered.   58 year old female with no significant past medical history presents with concern for left flank pain and pink-tinged urine.  On exam, does not have any reproducible pain with palpation, no midline tenderness, no CVA tenderness.  Her abdomen is soft and nontender.  No  history of kidney stones.  Chart reviewed, no prior imaging on file.  Her metabolic panel is without significant findings.  CBC within normals.  Urinalysis with small hemoglobin, ketones, protein and rare bacteria with 6-10 RBCs.  Patient does mention that she recently tested positive for trichomoniasis, was treated but had intercourse with her husband before he was treated and questions if she may have reinfected herself.  Her wet prep is negative.  CT abdomen pelvis without contrast/stone study obtained to evaluate for source of her hematuria, consider renal mass versus kidney stone.  She is found to have a pelvic mass of undetermined origin.  Discussed results with patient, advised this could be as simple as a fibroid or could be mass and potentially cancer.  Either way, requires follow-up.  Patient has [employer] but is does not have a PCP as she has not been able  to find someone in her network.  She is provided with gynecology resource list.  Also referral to social worker for social work to touch base with patient to make sure she was able to access follow-up.        Final Clinical Impression(s) / ED Diagnoses Final diagnoses:  Flank pain  Hematuria, unspecified type  Pelvic mass    Rx / DC Orders ED Discharge Orders          Ordered    naproxen  (NAPROSYN ) 500 MG tablet  2 times daily        12/12/23 2114              Darlis Eisenmenger, PA-C 12/12/23 2126    Ninetta Basket, MD 12/14/23 Merrianne Abbot

## 2024-02-26 DIAGNOSIS — D251 Intramural leiomyoma of uterus: Secondary | ICD-10-CM | POA: Insufficient documentation

## 2024-04-09 ENCOUNTER — Ambulatory Visit

## 2024-04-16 ENCOUNTER — Telehealth: Payer: Self-pay | Admitting: Emergency Medicine

## 2024-04-16 ENCOUNTER — Ambulatory Visit: Admission: EM | Admit: 2024-04-16 | Discharge: 2024-04-16 | Disposition: A | Discharge status: Home / Self Care

## 2024-04-16 ENCOUNTER — Encounter: Payer: Self-pay | Admitting: Emergency Medicine

## 2024-04-16 DIAGNOSIS — J069 Acute upper respiratory infection, unspecified: Secondary | ICD-10-CM | POA: Diagnosis not present

## 2024-04-16 MED ORDER — AZITHROMYCIN 500 MG PO TABS: 500.0000 mg | ORAL_TABLET | Freq: Every day | ORAL | 0 refills | Status: AC

## 2024-04-16 MED ORDER — MUCINEX DM MAXIMUM STRENGTH 60-1200 MG PO TB12: 1.0000 | ORAL_TABLET | Freq: Two times a day (BID) | ORAL | 0 refills | Status: AC

## 2024-04-16 MED ORDER — PREDNISONE 20 MG PO TABS: 40.0000 mg | ORAL_TABLET | Freq: Every day | ORAL | 0 refills | Status: AC

## 2024-04-16 MED ORDER — PROMETHAZINE-DM 6.25-15 MG/5ML PO SYRP: 10.0000 mL | ORAL_SOLUTION | Freq: Four times a day (QID) | ORAL | 0 refills | Status: AC | PRN

## 2024-04-16 NOTE — ED Provider Notes (Signed)
 [facility]    CSN: [id number] Arrival date & time: 04/16/24  1750      History   Chief Complaint Chief Complaint  Patient presents with   Cough   Nasal Congestion    HPI Offie Pickron is a 58 y.o. female.   Discussed the use of AI scribe software for clinical note transcription with the patient, who gave verbal consent to proceed.   The patient presents with a persistent cough that has been ongoing for approximately a week and a half. The cough is productive, bringing up mucus, and occurs constantly throughout the day. The patient describes an itchy sensation in the throat associated with the cough. The patient has attempted self-treatment with multiple over-the-counter medications including Vicks (which made her hoarse), Mucinex, and Tylenol  Flu. She also used her granddaughter's nebulizer for treatment as well. Despite these interventions, the cough has persisted, though she notes some improvement with the self-treatments. The patient has an upcoming fibroid tumor surgery scheduled for Tuesday and is concerned about clearing up the cough before the procedure. She reports no fevers associated with her symptoms. The patient is not diabetic.  The following sections of the patient's history were reviewed and updated as appropriate: allergies, current medications, past family history, past medical history, past social history, past surgical history, and problem list.       History reviewed. No pertinent past medical history.  Patient Active Problem List   Diagnosis Date Noted   Intramural leiomyoma of uterus 02/26/2024   Sinus bradycardia 05/27/2023    Past Surgical History:  Procedure Laterality Date   ANKLE SURGERY  2013    OB History   No obstetric history on file.      Home Medications    Prior to Admission medications   Medication Sig Start Date End Date Taking? Authorizing Provider  azithromycin (ZITHROMAX) 500 MG tablet Take 1 tablet (500  mg total) by mouth daily for 5 days. 04/16/24 04/21/24 Yes Javad Salva, Lucie, FNP  Dextromethorphan-guaiFENesin (MUCINEX DM MAXIMUM STRENGTH) 60-1200 MG TB12 Take 1 tablet by mouth 2 (two) times daily. 04/16/24  Yes Malina Geers, FNP  misoprostol (CYTOTEC) 200 MCG tablet SMARTSIG:1 Tablet(s) By Mouth 01/30/24  Yes [provider]  predniSONE (DELTASONE) 20 MG tablet Take 2 tablets (40 mg total) by mouth daily for 5 days. 04/16/24 04/21/24 Yes Iola Lucie, FNP  promethazine-dextromethorphan (PROMETHAZINE-DM) 6.25-15 MG/5ML syrup Take 10 mLs by mouth every 6 (six) hours as needed for cough. 04/16/24  Yes Finneus Kaneshiro, FNP  Suzetrigine (JOURNAVX) 50 MG TABS Take 50 mg by mouth. 02/19/24 05/19/24 Yes [provider]  aspirin 81 MG chewable tablet Chew by mouth daily.    [provider]  naproxen  (NAPROSYN ) 500 MG tablet Take 1 tablet (500 mg total) by mouth 2 (two) times daily. 12/12/23   Beverley Leita LABOR, PA-C    Family History History reviewed. No pertinent family history.  Social History Social History   Tobacco Use   Smoking status: Former    Current packs/day: 0.00    Average packs/day: 1 pack/day for 25.0 years (25.0 ttl pk-yrs)    Types: Cigarettes    Start date: 24    Quit date: 2016    Years since quitting: 9.7    Passive exposure: Current   Smokeless tobacco: Never  Vaping Use   Vaping status: Never Used  Substance Use Topics   Alcohol use: Not Currently   Drug use: Never     Allergies  Patient has no known allergies.   Review of Systems Review of Systems  Constitutional:  Negative for chills and fever.  HENT:  Positive for rhinorrhea and voice change (hoarse). Negative for sore throat (itchy).   Respiratory:  Positive for cough.   Musculoskeletal:  Negative for myalgias.  All other systems reviewed and are negative.    Physical Exam Triage Vital Signs ED Triage Vitals  Encounter Vitals Group     BP 04/16/24 1941 (!)  152/86     Girls Systolic BP Percentile --      Girls Diastolic BP Percentile --      Boys Systolic BP Percentile --      Boys Diastolic BP Percentile --      Pulse Rate 04/16/24 1941 74     Resp 04/16/24 1941 18     Temp 04/16/24 1941 98 F (36.7 C)     Temp Source 04/16/24 1941 Oral     SpO2 04/16/24 1941 97 %     Weight 04/16/24 1940 179 lb 14.3 oz (81.6 kg)     Height --      Head Circumference --      Peak Flow --      Pain Score 04/16/24 1940 0     Pain Loc --      Pain Education --      Exclude from Growth Chart --    No data found.  Updated Vital Signs BP (!) 152/86 (BP Location: Left Arm)   Pulse 74   Temp 98 F (36.7 C) (Oral)   Resp 18   Wt 179 lb 14.3 oz (81.6 kg)   SpO2 97%   BMI 33.99 kg/m   Visual Acuity Right Eye Distance:   Left Eye Distance:   Bilateral Distance:    Right Eye Near:   Left Eye Near:    Bilateral Near:     Physical Exam Vitals reviewed.  Constitutional:      General: She is awake. She is not in acute distress.    Appearance: Normal appearance. She is well-developed. She is not ill-appearing, toxic-appearing or diaphoretic.  HENT:     Head: Normocephalic.     Right Ear: Tympanic membrane, ear canal and external ear normal. No drainage, swelling or tenderness. No middle ear effusion. Tympanic membrane is not erythematous.     Left Ear: Tympanic membrane, ear canal and external ear normal. No drainage, swelling or tenderness.  No middle ear effusion. Tympanic membrane is not erythematous.     Nose: No congestion or rhinorrhea.     Mouth/Throat:     Lips: Pink.     Mouth: Mucous membranes are moist.     Pharynx: No pharyngeal swelling, oropharyngeal exudate, posterior oropharyngeal erythema or uvula swelling.     Tonsils: No tonsillar exudate or tonsillar abscesses.  Eyes:     General: Vision grossly intact.     Conjunctiva/sclera: Conjunctivae normal.  Cardiovascular:     Rate and Rhythm: Normal rate.     Heart sounds: Normal  heart sounds.  Pulmonary:     Effort: Pulmonary effort is normal. No tachypnea or respiratory distress.     Breath sounds: Normal breath sounds and air entry.  Musculoskeletal:        General: Normal range of motion.     Cervical back: Normal range of motion and neck supple.  Lymphadenopathy:     Cervical: No cervical adenopathy.  Skin:    General: Skin is warm and dry.  Neurological:  General: No focal deficit present.     Mental Status: She is alert and oriented to person, place, and time.  Psychiatric:        Behavior: Behavior is cooperative.      UC Treatments / Results  Labs (all labs ordered are listed, but only abnormal results are displayed) Labs Reviewed - No data to display  EKG   Radiology No results found.  Procedures Procedures (including critical care time)  Medications Ordered in UC Medications - No data to display  Initial Impression / Assessment and Plan / UC Course  I have reviewed the triage vital signs and the nursing notes.  Pertinent labs & imaging results that were available during my care of the patient were reviewed by me and considered in my medical decision making (see chart for details).     The patient presents with symptoms consistent with an upper respiratory infection. Exam is reassuring. Supportive care is recommended. Patient was advised to follow up with primary care if symptoms do not improve within one week or if new concerns arise. Instructions were given to seek emergency care if symptoms worsen, including shortness of breath, chest pain, persistent high fever, inability to tolerate fluids, or confusion.  Today's evaluation has revealed no signs of a dangerous process. Discussed diagnosis with patient and/or guardian. Patient and/or guardian aware of their diagnosis, possible red flag symptoms to watch out for and need for close follow up. Patient and/or guardian understands verbal and written discharge instructions. Patient  and/or guardian comfortable with plan and disposition.  Patient and/or guardian has a clear mental status at this time, good insight into illness (after discussion and teaching) and has clear judgment to make decisions regarding their care  Documentation was completed with the aid of voice recognition software. Transcription may contain typographical errors.   Final Clinical Impressions(s) / UC Diagnoses   Final diagnoses:  Upper respiratory tract infection, unspecified type     Discharge Instructions      Your symptoms are most likely due to a respiratory infection affecting your nose, throat, or lungs. Please take any medications prescribed to you as directed. Several over-the-counter medicines can make you more comfortable. Acetaminophen  (Tylenol ) or ibuprofen  (Advil , Motrin ) can help with fever, body aches, or sore throat pain. Decongestants such as Sudafed or nasal sprays like Flonase may relieve nasal congestion, while saline sprays or rinses can be used often to keep your nose clear. Sore throat discomfort may improve with lozenges, menthol or benzocaine sprays, or warm saltwater gargles. It is important to drink plenty of fluids to stay hydrated and help thin mucus. Aim for urine that is pale yellow. Using a cool mist humidifier at home, inhaling steam several times a day, and avoiding cool or dry air may also ease congestion. Sleeping with your head elevated can reduce post-nasal drainage, and getting enough rest each night supports recovery. Remember to replace your toothbrush once you start feeling better. A cough may last for several weeks after a respiratory illness even when other symptoms resolve, as the airways remain irritated. As long as the cough gradually improves and no new concerning symptoms appear, this is part of normal healing.  If your symptoms worsen or you develop new problems such as trouble breathing, chest pain, or high fever, go to the emergency room right  away.           ED Prescriptions     Medication Sig Dispense Auth. Provider   azithromycin (ZITHROMAX) 500 MG tablet  Take 1 tablet (500 mg total) by mouth daily for 5 days. 5 tablet Iola Lukes, FNP   predniSONE (DELTASONE) 20 MG tablet Take 2 tablets (40 mg total) by mouth daily for 5 days. 10 tablet Bethenny Losee, [place], FNP   Dextromethorphan-guaiFENesin (MUCINEX DM MAXIMUM STRENGTH) 60-1200 MG TB12 Take 1 tablet by mouth 2 (two) times daily. 20 tablet Iola Lukes, FNP   promethazine-dextromethorphan (PROMETHAZINE-DM) 6.25-15 MG/5ML syrup Take 10 mLs by mouth every 6 (six) hours as needed for cough. 118 mL Iola Lukes, FNP      PDMP not reviewed this encounter.   Iola Lukes, [place] 04/16/24 2029

## 2024-04-16 NOTE — Discharge Instructions (Addendum)
 Your symptoms are most likely due to a respiratory infection affecting your nose, throat, or lungs. Please take any medications prescribed to you as directed. Several over-the-counter medicines can make you more comfortable. Acetaminophen  (Tylenol ) or ibuprofen  (Advil , Motrin ) can help with fever, body aches, or sore throat pain. Decongestants such as Sudafed or nasal sprays like Flonase may relieve nasal congestion, while saline sprays or rinses can be used often to keep your nose clear. Sore throat discomfort may improve with lozenges, menthol or benzocaine sprays, or warm saltwater gargles. It is important to drink plenty of fluids to stay hydrated and help thin mucus. Aim for urine that is pale yellow. Using a cool mist humidifier at home, inhaling steam several times a day, and avoiding cool or dry air may also ease congestion. Sleeping with your head elevated can reduce post-nasal drainage, and getting enough rest each night supports recovery. Remember to replace your toothbrush once you start feeling better. A cough may last for several weeks after a respiratory illness even when other symptoms resolve, as the airways remain irritated. As long as the cough gradually improves and no new concerning symptoms appear, this is part of normal healing.  If your symptoms worsen or you develop new problems such as trouble breathing, chest pain, or high fever, go to the emergency room right away.

## 2024-04-16 NOTE — ED Triage Notes (Signed)
 Pt presents c/o cough and runny nose x 14 days. Pt reports she tried to self treat with Tylenol  Flu, Tussin, vicks improve. Pt denies any additional sxs.

## 2024-04-16 NOTE — Telephone Encounter (Signed)
 Pt was called for triage once in lobby and once via cell phone. Will attempt to contact pt again in 10 minutes (7:41 pm)
